# Patient Record
Sex: Male | Born: 1950 | Race: White | Hispanic: No | Smoking: Never smoker
Health system: Southern US, Community
[De-identification: ages and names within clinical notes are randomized; demographics above are authoritative.]

## PROBLEM LIST (undated history)

## (undated) DIAGNOSIS — K4020 Bilateral inguinal hernia, without obstruction or gangrene, not specified as recurrent: Secondary | ICD-10-CM

## (undated) DIAGNOSIS — E785 Hyperlipidemia, unspecified: Secondary | ICD-10-CM

## (undated) DIAGNOSIS — IMO0001 Reserved for inherently not codable concepts without codable children: Secondary | ICD-10-CM

## (undated) DIAGNOSIS — I10 Essential (primary) hypertension: Secondary | ICD-10-CM

## (undated) HISTORY — DX: Reserved for inherently not codable concepts without codable children: IMO0001

## (undated) HISTORY — DX: Essential (primary) hypertension: I10

## (undated) HISTORY — DX: Hyperlipidemia, unspecified: E78.5

---

## 2005-12-15 DIAGNOSIS — E785 Hyperlipidemia, unspecified: Secondary | ICD-10-CM

## 2005-12-15 DIAGNOSIS — I10 Essential (primary) hypertension: Secondary | ICD-10-CM

## 2005-12-15 HISTORY — DX: Essential (primary) hypertension: I10

## 2005-12-15 HISTORY — DX: Hyperlipidemia, unspecified: E78.5

## 2006-02-03 ENCOUNTER — Encounter: Payer: Self-pay | Admitting: Internal Medicine

## 2006-02-03 LAB — HM COLONOSCOPY

## 2006-12-15 DIAGNOSIS — IMO0001 Reserved for inherently not codable concepts without codable children: Secondary | ICD-10-CM

## 2006-12-15 HISTORY — DX: Reserved for inherently not codable concepts without codable children: IMO0001

## 2008-12-23 ENCOUNTER — Emergency Department (HOSPITAL_COMMUNITY): Admission: EM | Admit: 2008-12-23 | Discharge: 2008-12-23 | Payer: Self-pay | Admitting: Emergency Medicine

## 2008-12-26 ENCOUNTER — Encounter: Payer: Self-pay | Admitting: Internal Medicine

## 2008-12-26 LAB — CONVERTED CEMR LAB
BUN: 17 mg/dL
Creatinine, Ser: 0.9 mg/dL
Glucose, Bld: 120 mg/dL
Hgb A1c MFr Bld: 5.7 %
Sodium, serum: 139 mmol/L
Triglyceride fasting, serum: 93 mg/dL

## 2009-07-19 ENCOUNTER — Encounter: Payer: Self-pay | Admitting: Internal Medicine

## 2009-07-19 LAB — CONVERTED CEMR LAB
ALT: 25 units/L
Albumin: 4.3 g/dL
BUN: 17 mg/dL
CO2, serum: 27 mmol/L
CRP: 0.9 mg/dL
Calcium: 9.4 mg/dL
Cholesterol: 138 mg/dL
LDL Cholesterol: 77 mg/dL
Potassium, serum: 4 mmol/L
Sodium, serum: 140 mmol/L
Total Protein: 6.8 g/dL

## 2009-07-20 ENCOUNTER — Encounter: Payer: Self-pay | Admitting: Internal Medicine

## 2009-07-20 LAB — CONVERTED CEMR LAB: Vit D, 25-Hydroxy: 59.8 ng/mL

## 2010-02-25 ENCOUNTER — Encounter (INDEPENDENT_AMBULATORY_CARE_PROVIDER_SITE_OTHER): Payer: Self-pay | Admitting: *Deleted

## 2010-03-25 ENCOUNTER — Ambulatory Visit: Payer: Self-pay | Admitting: Internal Medicine

## 2010-03-25 DIAGNOSIS — I10 Essential (primary) hypertension: Secondary | ICD-10-CM

## 2010-03-25 DIAGNOSIS — E785 Hyperlipidemia, unspecified: Secondary | ICD-10-CM

## 2010-03-26 ENCOUNTER — Ambulatory Visit: Payer: Self-pay | Admitting: Internal Medicine

## 2010-03-27 LAB — CONVERTED CEMR LAB
ALT: 24 units/L (ref 0–53)
Chloride: 107 meq/L (ref 96–112)
GFR calc non Af Amer: 92.15 mL/min (ref >60)
Glucose, Bld: 97 mg/dL (ref 70–99)
HDL: 55.6 mg/dL (ref >39.00)
Potassium: 4.7 meq/L (ref 3.5–5.1)
Sodium: 146 meq/L — ABNORMAL HIGH (ref 135–145)
Total CHOL/HDL Ratio: 3
Triglycerides: 108 mg/dL (ref 0.0–149.0)

## 2010-05-15 ENCOUNTER — Ambulatory Visit: Payer: Self-pay | Admitting: Internal Medicine

## 2010-05-20 ENCOUNTER — Telehealth (INDEPENDENT_AMBULATORY_CARE_PROVIDER_SITE_OTHER): Payer: Self-pay | Admitting: *Deleted

## 2010-05-20 LAB — CONVERTED CEMR LAB
BUN: 17 mg/dL (ref 6–23)
Calcium: 9.2 mg/dL (ref 8.4–10.5)
Cholesterol: 243 mg/dL — ABNORMAL HIGH (ref 0–200)
Creatinine, Ser: 0.9 mg/dL (ref 0.4–1.5)
GFR calc non Af Amer: 98.39 mL/min (ref >60)
Glucose, Bld: 102 mg/dL — ABNORMAL HIGH (ref 70–99)
HDL: 50.5 mg/dL (ref >39.00)
Triglycerides: 86 mg/dL (ref 0.0–149.0)
VLDL: 17.2 mg/dL (ref 0.0–40.0)

## 2010-06-28 ENCOUNTER — Telehealth: Payer: Self-pay | Admitting: Internal Medicine

## 2010-07-29 ENCOUNTER — Telehealth (INDEPENDENT_AMBULATORY_CARE_PROVIDER_SITE_OTHER): Payer: Self-pay | Admitting: *Deleted

## 2010-07-31 ENCOUNTER — Telehealth (INDEPENDENT_AMBULATORY_CARE_PROVIDER_SITE_OTHER): Payer: Self-pay | Admitting: *Deleted

## 2010-08-08 ENCOUNTER — Ambulatory Visit: Payer: Self-pay | Admitting: Internal Medicine

## 2010-08-12 LAB — CONVERTED CEMR LAB
ALT: 20 units/L (ref 0–53)
AST: 21 units/L (ref 0–37)
Cholesterol: 210 mg/dL — ABNORMAL HIGH (ref 0–200)
Direct LDL: 138.5 mg/dL
HDL: 51.2 mg/dL (ref >39.00)

## 2011-01-14 NOTE — Progress Notes (Signed)
Summary: lab results  Phone Note Outgoing Call   Call placed by: [facility] CMA,  May 20, 2010 11:17 AM Details for Reason: advise patient His LDL continued to be elevated despite his healthy lifestyle.  I recommend start Pravachol 40 mg daily, likely it won't cause the muscle aches that crestor did. Please come back fasting in 2 months: FLP  AST ALT---- DX high cholesterol.  No need for office visit,  just lab  Summary of Call: left message to call...................Marland KitchenFelecia Deloach CMA  May 20, 2010 11:17 AM   Follow-up for Phone Call        DISCUSS WITH PATIENT, letter mailed, rx sent to pharmacy...............Marland KitchenFelecia Deloach CMA  May 20, 2010 4:31 PM     New/Updated Medications: PRAVACHOL 40 MG TABS (PRAVASTATIN SODIUM) Take 1 tab  daily Prescriptions: PRAVACHOL 40 MG TABS (PRAVASTATIN SODIUM) Take 1 tab  daily  #30 x 1   Entered and Authorized by:   Jeremy Johann CMA   Signed by:   Jeremy Johann CMA on 05/20/2010   Method used:   [facility]axed to ...       [facility] [address] (retail)       [address]       [place], [place]  [address]       Ph: [phone] or [phone]       Fax: [phone]   RxID:   [id number]

## 2011-01-14 NOTE — Procedures (Signed)
Summary: Colonoscopy/[facility]----internal hemorrhoid, one polyp  Colonoscopy/[facility]   Imported By: Lanelle Bal 04/09/2010 12:05:45  _____________________________________________________________________  External Attachment:    Type:   Image     Comment:   External Document

## 2011-01-14 NOTE — Miscellaneous (Signed)
Summary: colonoscopy report  Clinical Lists Changes  Observations: Added new observation of COLONRECACT: Repeat colonoscopy in 10 years.   (02/03/2006 9:48) Added new observation of COLONOSCOPY: Results: Hemorrhoids (small internal)    Pathology:  Hyperplastic polyp.     Herbert Moors, MD Location:  [facility].    (02/03/2006 9:48)      Colonoscopy  Procedure date:  02/03/2006  Findings:      Results: Hemorrhoids (small internal)    Pathology:  Hyperplastic polyp.     Herbert Moors, MD Location:  [facility].     Comments:      Repeat colonoscopy in 10 years.     Colonoscopy  Procedure date:  02/03/2006  Findings:      Results: Hemorrhoids (small internal)    Pathology:  Hyperplastic polyp.     Herbert Moors, MD Location:  [facility].     Comments:      Repeat colonoscopy in 10 years.    Appended Document: colonoscopy report    report to light to scan  biopsy show a hyperplastic polyp.  Plan per  GI is  to repeat in 10 years

## 2011-01-14 NOTE — Assessment & Plan Note (Signed)
Summary: new to est//bcbs ins//lch   Vital Signs:  Patient profile:   60 year old male Height:      69 inches Weight:      194.4 pounds BMI:     28.81 Pulse rate:   60 / minute BP sitting:   140 / 90  Vitals Entered By: Shary Decamp (March 25, 2010 11:50 AM) CC: new pt to establish, would like to change meds to generic, pt was on zetia 10 but d/c'd due to some of the things he read in the media, not fasting Comments BP @ home 118-120/80s [facility]  March 25, 2010 11:51 AM    History of Present Illness: new pt to establish would like to change meds to generic  high cholesterol  pt was on zetia 10 but self  d/c'd  2 months ago due to some of the things he read in the media on crestor as well x 2-3 years    Hypertension on Azor, metoprolol XR  Preventive Screening-Counseling & Management  Alcohol-Tobacco     Smoking Status: never  Caffeine-Diet-Exercise     Caffeine use/day: 2     Does Patient Exercise: yes     Times/week: 4  Current Medications (verified): 1)  Crestor 40 Mg Tabs (Rosuvastatin Calcium) .... 1/2 By Mouth Qd 2)  Bayer Low Strength 81 Mg Tbec (Aspirin) .... Once Daily 3)  Azor 5-20 Mg Tabs (Amlodipine-Olmesartan) .Marland Kitchen.. 1 By Mouth Once Daily 4)  Metoprolol Succinate 50 Mg Xr24h-Tab (Metoprolol Succinate) .... Once Daily 5)  Flonase 50 Mcg/act Susp (Fluticasone Propionate) .... 2 Sprays Each Nostril Once Daily As Needed Seasonal Allergies  Allergies (verified): 1)  ! 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 Resp:  Denies cough and shortness of breath. GI:  Denies bloody stools, diarrhea, nausea, and vomiting. GU:  Denies dysuria, urinary frequency, and urinary hesitancy. MS:  occasionally aches and pains, more since he started crestor .  Physical Exam  General:  alert and well-developed.   Neck:  no masses and no thyromegaly.   Lungs:  normal respiratory effort, no intercostal retractions, no accessory muscle use, and normal breath sounds.   Heart:  normal rate, regular rhythm, and no murmur.   Extremities:  no pretibial edema bilaterally  Psych:  Oriented X3, memory intact for recent and remote, normally interactive, good eye contact, not anxious appearing, and not depressed appearing.     Impression & Recommendations:  Problem # 1:  HYPERTENSION (ICD-401.9) Cost is a issue   Will discontinue Azor and start AMLODIPINE BESY-BENAZEPRIL HCL 5-40  side effects including cough,  discussed Will continue with metoprolo as it is   His updated medication list for this problem includes:    Amlodipine Besy-benazepril Hcl 5-40 Mg Caps (  Amlodipine besy-benazepril hcl) .Marland Kitchen... 1 by mouth once daily    Metoprolol Succinate 50 Mg Xr24h-tab (Metoprolol succinate) ..... Once daily  Problem # 2:  HYPERLIPIDEMIA (ICD-272.4) his last cholesterol was 138, HDL 57, LDL 77 (8-10)  was on Crestor and zetia (which he took inconsistently) patient has more aches and pains ( although not severe)  since he started Crestor we elected to see how he's doing with he's healthy diet - exercise-crestor, then re asses his cholesterol off meds  Plan: Check labs while in Crestor, then D/C it come back in 6 weeks, fasting I would obtain an  FLP for baseline Will keep in mind that he  has a family history heart disease, his LDL goal is 100 benefits of statins in general discussed  The following medications were removed from the medication list:    Crestor 40 Mg Tabs (Rosuvastatin calcium) .Marland Kitchen... 1/2 by mouth qd  Complete Medication List: 1)  Bayer Low Strength 81 Mg Tbec (Aspirin) .... Once daily 2)  Amlodipine Besy-benazepril Hcl 5-40 Mg Caps (Amlodipine besy-benazepril hcl) .Marland Kitchen.. 1 by mouth once daily 3)  Metoprolol Succinate 50 Mg Xr24h-tab (Metoprolol succinate) .... Once daily 4)  Flonase 50 Mcg/act Susp (Fluticasone propionate) .... 2 sprays each nostril once daily as needed seasonal allergies  Patient Instructions: 1)  continue taking Crestor until  you have thwe blood  work 2)  come back fasting: FLP AST ALT dx high cholesterol, BMP dx and hypertension 3)  switch from azor to the new BP med 4)  Check your blood pressure 2 or 3 times a week. If it is more than 140/85 consistently,please let us know  5)  call if side effects from that new medication 6)  Please schedule a follow-up appointment in 6 weeks (fasting) Prescriptions: FLONASE 50 MCG/ACT SUSP (FLUTICASONE PROPIONATE) 2 sprays each nostril once daily as needed seasonal allergies  #1 x 12   Entered and Authorized by:   Nolon Rod. Angelia Hazell MD   Signed by:   Nolon Rod. Kirsten Spearing MD on 03/25/2010   Method used:   Print then Give to Patient   RxID:   [id number] METOPROLOL SUCCINATE 50 MG XR24H-TAB (METOPROLOL SUCCINATE) once daily  #30 x 12   Entered and Authorized by:   Kwasi Joung E. Javiana Anwar MD   Signed by:   Nolon Rod. Coreon Simkins MD on 03/25/2010   Method used:   Print then Give to Patient   RxID:   [id number] AMLODIPINE BESY-BENAZEPRIL HCL 5-40 MG CAPS (AMLODIPINE BESY-BENAZEPRIL HCL) 1 by mouth once daily  #30 x 3   Entered and Authorized by:   Nolon Rod. Nayellie Sanseverino MD   Signed by:   Nolon Rod. Leonidas Boateng MD on 03/25/2010   Method used:   Print then Give to Patient   RxID:   [id number]    Risk Factors:  Tobacco use:  never Caffeine  use:  2 drinks per day Alcohol use:  yes    Comments:  socially Exercise:  yes    Times per week:  4   Appended Document: new to est//bcbs ins//lch office visit notes from Dr.Altheimer reviewed, they are to light to be scanned: --history of tinnitus since 2009, he has seen an ENT in [place] and later on saw Dr. Tia Masker on December 2009, was treated with antibiotics high-dose of prednisone reportedly had a normal brain MRI --he went to the urgent care and then transferred to [facility]  ER on 12/23/2008 he was diagnosed with atypical chest pain and elevated BP  he saw Dr.Kersey 12/28/2008,  echo stress test on 01/02/2009 which was normal

## 2011-01-14 NOTE — Miscellaneous (Signed)
Summary: labs from previous MD  Clinical Lists Changes  Observations: Added new observation of HGBA1C: 5.7 % (12/26/2008 11:33) Added new observation of TRIGLYCERIDE: 93 mg/dL (21/30/8657 84:69) Added new observation of HDL: 70 mg/dL (62/95/2841 32:44) Added new observation of LDL: 81 mg/dL (12/17/7251 66:44) Added new observation of CHOLESTEROL: 170 mg/dL (03/47/4259 56:38) Added new observation of SGPT (ALT): 22 units/L (12/26/2008 11:33) Added new observation of SGOT (AST): 21 units/L (12/26/2008 11:33) Added new observation of BG RANDOM: 120 mg/dL (75/64/3329 51:88) Added new observation of CREATININE: 0.9 mg/dL (41/66/0630 16:01) Added new observation of BUN: 17 mg/dL (09/32/3557 32:20) Added new observation of POTASSIUM: 3.9 mmol/L (12/26/2008 11:33) Added new observation of SODIUM: 139 mmol/L (12/26/2008 11:33)      Chemistry Labs Test Date: 12/26/2008                      Value Units        H/L   Reference  Sodium:             139   mmol/L             (137-145) Potassium:          3.9   mmol/L             (3.6-5.0) BUN:                17    mg/dL              (2-54) Creatinine:         0.9   mg/dL              (2.7-0.6) Glucose-random:     120   mg/dL         H    (23-762) SGOT:               21    U/L                (10-40) SGPT:               22    U/L                (10-40)    Lipid Panel Test Date: 12/26/2008                        Value        Units        H/L   Reference  Cholesterol:          170          mg/dL              (831-517) LDL Cholesterol:      81           mg/dL              (61-607) HDL Cholesterol:      70           mg/dL              (37-10) Triglyceride:         93           mg/dL              (62-694) WNIO2V                5.7

## 2011-01-14 NOTE — Assessment & Plan Note (Signed)
Summary: 6 week fu/fasting/kdc   Vital Signs:  Patient profile:   60 year old male Height:      69 inches Weight:      187 pounds Temp:     97.8 degrees F oral Pulse rate:   68 / minute BP sitting:   118 / 70  (left arm)  Vitals Entered By: Jeremy Johann CMA (May 15, 2010 8:25 AM) CC: 6 week f/u,fasting Comments REVIEWED MED LIST, PATIENT AGREED DOSE AND INSTRUCTION CORRECT    History of Present Illness: here for a followup Doing well See last office visit, we agreed to discontinue Crestor and recheck his cholesterol today. His diet has improved significantly, he has lost some weight  Allergies: 1)  ! Pcn  Past History:  Past Medical History: Reviewed history from 03/25/2010 and no changes required. Hyperlipidemia--  dx aprox 2007 Hypertension--  dx aprox 2007 stress test aprox 2008 , neg per patient   Past Surgical History: Reviewed history from 03/25/2010 and no changes required. Denies surgical history  Social History: Reviewed history from 03/25/2010 and no changes required. 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 Occasionally he feels dizzy when he stands up quickly. No generalized weakness or fatigue. no cough After he discontinued Crestor the  aches and pains decreased substantially, he is feeling great. He's taking fish oil and red rise yeast to help his cholesterol lower extremity edema at the end of the day, not consistently, only from time to time.  Physical Exam  General:  alert, well-developed, and well-nourished.   Lungs:  normal respiratory effort, no intercostal retractions, no accessory muscle use, and normal breath sounds.   Heart:  normal rate, regular rhythm, and no murmur.   Extremities:  no lower extremity edema   Impression &  Recommendations:  Problem # 1:  HYPERTENSION (ICD-401.9) we switched to a generic after last office visit, he is tolerating great. His blood pressure may be over controlled, he gets dizzy from time to time when he stands up. Plan---decrease metoprolol from 50 to 25 mg daily His updated medication list for this problem includes:    Amlodipine Besy-benazepril Hcl 5-40 Mg Caps (Amlodipine besy-benazepril hcl) .Marland Kitchen... 1 by mouth once daily    Metoprolol Succinate 50 Mg Xr24h-tab (Metoprolol succinate) .Marland Kitchen... 1/2 by mouth once daily  BP today: 118/70 Prior BP: 140/90 (03/25/2010)  Labs Reviewed: K+: 4.7 (03/26/2010) Creat: : 0.9 (03/26/2010)   Chol: 170 (03/26/2010)   HDL: 55.60 (03/26/2010)   LDL: 93 (03/26/2010)   TG: 108.0 (03/26/2010)  Orders: TLB-BMP (Basic Metabolic Panel-BMET) (80048-METABOL)  Problem # 2:  HYPERLIPIDEMIA (ICD-272.4) the last fasting lipid profile reflects the results of taking Crestor, he is now off Crestor for 6 weeks, his diet has improved, he has lost some weight He also had aches and pains with crestor. Plan: Recheck his cholesterol, LDL goal is around 100 given  his family history Labs Reviewed: SGOT: 29 (03/26/2010)   SGPT: 24 (03/26/2010)   HDL:55.60 (03/26/2010), 57 (07/19/2009)  LDL:93 (03/26/2010), 77 (07/19/2009)  Chol:170 (03/26/2010), 138 (07/19/2009)  Trig:108.0 (03/26/2010), 79 (07/19/2009)  Orders: Venipuncture ([address]) TLB-Lipid Panel (80061-LIPID)  Problem # 3:  has a 2 mm cyst at the left upper eyelid patient is planning to contact ophthalmology, will call if a referral is needed  Complete Medication List: 1)  Bayer Low Strength 81 Mg Tbec (Aspirin) .... Once daily 2)  Amlodipine Besy-benazepril Hcl 5-40 Mg Caps (Amlodipine besy-benazepril hcl) .Marland Kitchen.. 1 by mouth once daily 3)  Metoprolol Succinate 50 Mg Xr24h-tab (Metoprolol succinate) .... 1/2 by mouth once daily 4)  Flonase 50 Mcg/act Susp (Fluticasone propionate) .... 2 sprays each nostril  once daily as needed seasonal allergies  Patient Instructions: 1)  Please schedule a follow-up appointment in 6 months .

## 2011-01-14 NOTE — Progress Notes (Signed)
Summary: feet swelling  Phone Note Call from Patient Call back at [phone] cell   Caller: Patient Call For: [facility] E. Keelon Zurn MD Summary of Call: Pt called stating that he is now having swelling in his feet and ankles by the end of the day. Bp at home 105/68.  Please advise. If BP med is changed he would like a generic med.  Nicki Guadalajara Fergerson CMA Duncan Dull)  June 28, 2010 11:14 AM   Follow-up for Phone Call        to prevent leg swelling I recommend the following: -low salt diet -elevate the legs -discontinue amlodipine/benazepril  and take losartan 100mg  1 a day -Nurse visit for BP check and BMP in 4 weeks Moriah Shawley E. Myrtice Lowdermilk MD  June 28, 2010 3:52 PM   Additional Follow-up for Phone Call Additional follow up Details #1::        Pt is aware of instructions. Army Fossa CMA  June 28, 2010 4:33 PM     New/Updated Medications: COZAAR 100 MG TABS (LOSARTAN POTASSIUM) 1 by mouth daily. Prescriptions: COZAAR 100 MG TABS (LOSARTAN POTASSIUM) 1 by mouth daily.  #30 x 0   Entered by:   Army Fossa CMA   Authorized by:   Nolon Rod. Massiel Stipp MD   Signed by:   Army Fossa CMA on 06/28/2010   Method used:   [facility]        [phone][facility] ([facility]ail[facility]       [address]       [place], [place]  [address]       Ph: [phone] or [phone]       Fax: [phone]   RxID:   [id number]

## 2011-01-14 NOTE — Letter (Signed)
Summary: New Patient Letter  [facility]  [address] [place], [place] [address]   Phone: [phone]  Fax: [phone]       02/25/2010 MRN: [id number]  [facility] [address] [place], [place]  [address]  Dear Mr. [facility],   Welcome to [facility] and thank you for choosing [place] as your Primary Care Providers. Enclosed you will find information about our practice that we hope you find helpful. We have also enclosed forms to be filled out prior to your visit. This will provide [place] with the necessary information and facilitate your being seen in a timely manner. If you have any questions, please call us at:  [phone] and we will be happy to assist you. We look forward to seeing you at your scheduled appointment time.  Appointment   Tuesday March 19, 2010 @ 3:20pm  with Dr.  Drue Novel      Sincerely,  Primary Health Care Team  Please arrive 15 minutes early for your first appointment and bring your insurance card. Co-pay is required at the time of your visit.  *****Please call the office if you are not able to keep this appointment. There is a charge of $50.00 if any appointment is not cancelled or rescheduled within 24 hours.

## 2011-01-14 NOTE — Miscellaneous (Signed)
Summary: labs from previous MD  Clinical Lists Changes  Observations: Added new observation of VIT D 25-OH: 59.8 ng/mL (07/20/2009 10:01) Added new observation of PROTEIN, TOT: 6.8 g/dL (16/09/9603 54:09) Added new observation of ALBUMIN: 4.3 g/dL (81/19/1478 29:56) Added new observation of BILI TOTAL: 0.6 mg/dL (21/30/8657 84:69) Added new observation of ALK PHOS: 51 units/L (07/19/2009 10:00) Added new observation of CALCIUM: 9.4 mg/dL (62/95/2841 32:44) Added new observation of BG RANDOM: 111 mg/dL (12/17/7251 66:44) Added new observation of CREATININE: 1.1 mg/dL (03/47/4259 56:38) Added new observation of BUN: 17 mg/dL (75/64/3329 51:88) Added new observation of CO2 TOTAL: 27 mmol/L (07/19/2009 10:00) Added new observation of CHLORIDE: 105 mmol/L (07/19/2009 10:00) Added new observation of POTASSIUM: 4.0 mmol/L (07/19/2009 10:00) Added new observation of SODIUM: 140 mmol/L (07/19/2009 10:00) Added new observation of SGPT (ALT): 25 units/L (07/19/2009 9:58) Added new observation of SGOT (AST): 24 units/L (07/19/2009 9:58) Added new observation of HGBA1C: 5.2 % (07/19/2009 9:58) Added new observation of CRP: 0.90 mg/dL (41/66/0630 1:60) Added new observation of TRIGLYCERIDE: 79 mg/dL (10/93/2355 7:32) Added new observation of HDL: 57 mg/dL (20/25/4270 6:23) Added new observation of LDL: 77 mg/dL (76/28/3151 7:61) Added new observation of CHOLESTEROL: 138 mg/dL (60/73/7106 2:69)      Lipid Panel Test Date: 07/19/2009                        Value        Units        H/L   Reference  Cholesterol:          138          mg/dL              (485-462) LDL Cholesterol:      77           mg/dL              (70-350) HDL Cholesterol:      57           mg/dL              (09-38) Triglyceride:         79           mg/dL              (18-299) CRP                   0.90                            HgBA1c                5.2                             SGOT (AST)            24                               SGPT (ALT)            25                                 Chemistry Labs Test Date: 07/19/2009  Value Units        H/L   Reference  Sodium:             140   mmol/L             (137-145) Potassium:          4.0   mmol/L             (3.6-5.0) Chloride:           105   mmol/L             (101-111) CO2:                27    mmol/L             (22-31) BUN:                17    mg/dL              (1-61) Creatinine:         1.1   mg/dL              (0.9-6.0) Glucose-random:     111   mg/dL         H    (45-409) Calcium (total):    9.4   mg/dL              (8-11.9) Alkaline P'tase:    51    U/L                (10-120) T. Bili:            0.6   mg/dL              (1.4-7.8) Albumin:            4.3   g/dL               (3-5) Total Protein:      6.8   g/dL               (4-7)   Lab Entry Test Date: 07/20/2009                        Value        Units        H/L   Reference  Vit D. 25-OH:         59.8         ng/ml              (16-74)

## 2011-01-14 NOTE — Progress Notes (Signed)
Summary: labwork  Phone Note Outgoing Call   Call placed by: Army Fossa CMA,  July 29, 2010 8:05 AM Summary of Call: Needs labwork:   FLP  AST ALT---- DX high cholesterol.  Follow-up for Phone Call        PT HAS APPT 08/08/10 FOR LABS.Marland KitchenKalyn [name]  July 29, 2010 11:21 AM

## 2011-01-14 NOTE — Progress Notes (Signed)
Summary: REFILL REQUEST  Phone Note Refill Request Call back at [phone] Message from:  Pharmacy on July 31, 2010 8:05 AM  Refills Requested: Medication #1:  COZAAR 100 MG TABS 1 by mouth daily.   Dosage confirmed as above?Dosage Confirmed   Supply Requested: 1 month   Last Refilled: 06/28/[facility] [address]  Next Appointment Scheduled: 08/08/10 Initial call taken by: Lavell Islam,  July 31, 2010 8:05 AM    Prescriptions: COZAAR 100 MG TABS (LOSARTAN POTASSIUM) 1 by mouth daily.  #30 x 3   Entered by:   Army Fossa CMA   Authorized by:   Nolon Rod. Paz MD   Signed by:   Army Fossa CMA on 07/31/2010   Method used:   [facility]        [facility] [phone][facility] (retail)       [address]       [place], [place]  [address]       Ph: [phone] or [phone]       Fax: [phone]   RxID:   [id number]

## 2011-03-10 ENCOUNTER — Other Ambulatory Visit: Payer: Self-pay | Admitting: Internal Medicine

## 2011-03-31 LAB — POCT I-STAT, CHEM 8
BUN: 16 mg/dL (ref 6–23)
Calcium, Ion: 1.11 mmol/L — ABNORMAL LOW (ref 1.12–1.32)
Creatinine, Ser: 1 mg/dL (ref 0.4–1.5)
Hemoglobin: 14.3 g/dL (ref 13.0–17.0)
Potassium: 4 mEq/L (ref 3.5–5.1)
TCO2: 25 mmol/L (ref 0–100)

## 2011-03-31 LAB — POCT CARDIAC MARKERS
Myoglobin, poc: 75.8 ng/mL (ref 12–200)
Myoglobin, poc: 82.6 ng/mL (ref 12–200)
Troponin i, poc: <0.05 ng/mL (ref 0.00–0.09)

## 2011-03-31 LAB — DIFFERENTIAL
Basophils Relative: 0 % (ref 0–1)
Lymphocytes Relative: 12 % (ref 12–46)
Monocytes Relative: 9 % (ref 3–12)
Neutro Abs: 5.6 10*3/uL (ref 1.7–7.7)
Neutrophils Relative %: 77 % (ref 43–77)

## 2011-03-31 LAB — CBC
MCHC: 34 g/dL (ref 30.0–36.0)
RBC: 4.61 MIL/uL (ref 4.22–5.81)
WBC: 7.3 10*3/uL (ref 4.0–10.5)

## 2011-04-15 ENCOUNTER — Ambulatory Visit (INDEPENDENT_AMBULATORY_CARE_PROVIDER_SITE_OTHER): Payer: BC Managed Care – PPO | Admitting: Internal Medicine

## 2011-04-15 ENCOUNTER — Encounter: Payer: Self-pay | Admitting: Internal Medicine

## 2011-04-15 VITALS — BP 128/84 | HR 62 | Ht 68.5 in | Wt 188.4 lb

## 2011-04-15 DIAGNOSIS — I10 Essential (primary) hypertension: Secondary | ICD-10-CM

## 2011-04-15 DIAGNOSIS — Z23 Encounter for immunization: Secondary | ICD-10-CM

## 2011-04-15 DIAGNOSIS — Z136 Encounter for screening for cardiovascular disorders: Secondary | ICD-10-CM

## 2011-04-15 DIAGNOSIS — Z0000 Encounter for general adult medical examination without abnormal findings: Secondary | ICD-10-CM

## 2011-04-15 LAB — CBC WITH DIFFERENTIAL/PLATELET
Basophils Relative: 0.3 % (ref 0.0–3.0)
Eosinophils Absolute: 0.1 10*3/uL (ref 0.0–0.7)
Eosinophils Relative: 2 % (ref 0.0–5.0)
Hemoglobin: 14.2 g/dL (ref 13.0–17.0)
Lymphocytes Relative: 20.6 % (ref 12.0–46.0)
Monocytes Relative: 6.7 % (ref 3.0–12.0)
Neutro Abs: 4.5 10*3/uL (ref 1.4–7.7)
Neutrophils Relative %: 70.4 % (ref 43.0–77.0)
RBC: 4.55 Mil/uL (ref 4.22–5.81)
WBC: 6.4 10*3/uL (ref 4.5–10.5)

## 2011-04-15 LAB — LIPID PANEL
Cholesterol: 197 mg/dL (ref 0–200)
LDL Cholesterol: 132 mg/dL — ABNORMAL HIGH (ref 0–99)
Total CHOL/HDL Ratio: 4
VLDL: 14.8 mg/dL (ref 0.0–40.0)

## 2011-04-15 LAB — BASIC METABOLIC PANEL
BUN: 19 mg/dL (ref 6–23)
Chloride: 106 mEq/L (ref 96–112)
GFR: 88.41 mL/min (ref >60.00)
Glucose, Bld: 88 mg/dL (ref 70–99)
Potassium: 4.5 mEq/L (ref 3.5–5.1)
Sodium: 143 mEq/L (ref 135–145)

## 2011-04-15 LAB — AST: AST: 22 U/L (ref 0–37)

## 2011-04-15 MED ORDER — PRAVASTATIN SODIUM 40 MG PO TABS: 40.0000 mg | ORAL_TABLET | Freq: Every day | ORAL | Status: DC

## 2011-04-15 MED ORDER — LOSARTAN POTASSIUM 100 MG PO TABS: ORAL_TABLET | ORAL | Status: DC

## 2011-04-15 MED ORDER — TETANUS-DIPHTH-ACELL PERTUSSIS 5-2.5-18.5 LF-MCG/0.5 IM SUSP
0.5000 mL | Freq: Once | INTRAMUSCULAR | Status: AC
  Administered 2011-04-15: 0.5 mL via INTRAMUSCULAR

## 2011-04-15 MED ORDER — METOPROLOL SUCCINATE ER 50 MG PO TB24: ORAL_TABLET | ORAL | Status: DC

## 2011-04-15 NOTE — Assessment & Plan Note (Signed)
continue same medications

## 2011-04-15 NOTE — Progress Notes (Signed)
  Subjective:    Patient ID: Ralph [name], male    DOB: 1951/11/24, 60 y.o.   MRN: [id number]  HPI CPX occ edema at feet at the end of the day, mild  Past Medical History  Diagnosis Date  . Hyperlipidemia 2007  . Hypertension 2007  . Normal cardiac stress test 2008    per pt   No past surgical history on file. Family History  Problem Relation Age of Onset  . Breast cancer Mother   . Bone cancer Mother   . Coronary artery disease Father     w/ first MI age 18, GF MI in the early 26s  . Prostate cancer Neg Hx   . Colon cancer Neg Hx   . Hypertension Sister   . Diabetes Neg Hx    History   Social History  . Marital Status: Divorced    Spouse Name: N/A    Number of Children: 1  . Years of Education: N/A   Occupational History  . sales, hearing aid    Social History Main Topics  . Smoking status: Never Smoker   . Smokeless tobacco: Not on file  . Alcohol Use: Yes     socially  . Drug Use: No  . Sexually Active: Not on file   Other Topics Concern  . Not on file   Social History Narrative   Diet- healthy for the most part...Marland KitchenMarland KitchenExercise--regular in the gym...[place]       Review of Systems  Constitutional: Negative for fever, fatigue and unexpected weight change.  Respiratory: Negative for cough and shortness of breath.   Cardiovascular: Negative for chest pain and palpitations.       Rarely has CP, previous stress test (-), sx not crescendo  Gastrointestinal: Negative for abdominal pain and blood in stool.  Genitourinary: Negative for hematuria and difficulty urinating.  Psychiatric/Behavioral:       No anxiety, depression       Objective:   Physical Exam  Constitutional: He is oriented to person, place, and time. He appears well-developed and well-nourished. No distress.  HENT:  Head: Normocephalic and atraumatic.  Eyes: No scleral icterus.  Neck: No thyromegaly present.       Normal carotid pulses B  Cardiovascular: Normal  rate, regular rhythm and normal heart sounds.   No murmur heard. Pulmonary/Chest: Effort normal and breath sounds normal. No respiratory distress. He has no wheezes. He has no rales.  Abdominal: Soft. Bowel sounds are normal. He exhibits no distension. There is no tenderness. There is no rebound and no guarding.  Genitourinary: Rectum normal and prostate normal.  Musculoskeletal: He exhibits no edema.  Neurological: He is alert and oriented to person, place, and time.  Skin: Skin is warm and dry.  Psychiatric: He has a normal mood and affect. His behavior is normal. Judgment and thought content normal.          Assessment & Plan:

## 2011-04-15 NOTE — Assessment & Plan Note (Addendum)
Td today Cscope 01-2006, told next in 10 years Diet-exercise discussed + FH CAD, occ -chronic CP, pt thinks is muscleskeletal (usually the next day after wt lifting), neg stress test 2008: rec observation, sx of AMI discussed . ASA qd Labs  Since he is doing very well will recommend to RTC 1 year Also rec to see derm ,has fair skin and multiple skin lesions  EKG WNL

## 2011-04-15 NOTE — Patient Instructions (Signed)
See your dermatologist once a year

## 2011-04-17 ENCOUNTER — Telehealth: Payer: Self-pay | Admitting: *Deleted

## 2011-04-17 NOTE — Telephone Encounter (Signed)
Message copied by Army Fossa on Thu Apr 17, 2011  1:23 PM ------      Message from: Willow Ora      Created: Thu Apr 17, 2011 12:34 PM       Advise patient:      His LDL cholesterol is 132. Given his family history of heart dz, his goal is less than 100.      Recommend to switch from Pravachol 40mg  to Lipitor 40 mg 1 tablet daoly, call in a prescription #30 and 4 RF; give a discount card.      Please arrange for a FLP, AST, ALT in 3 months.      Patient to let me know if he has side effects.      All other labs very good.

## 2011-04-17 NOTE — Telephone Encounter (Signed)
Message left for patient to return my call.  

## 2011-04-18 MED ORDER — ATORVASTATIN CALCIUM 40 MG PO TABS: 40.0000 mg | ORAL_TABLET | Freq: Every day | ORAL | Status: DC

## 2011-04-18 NOTE — Telephone Encounter (Signed)
I spoke w/ pt he is aware. Mailed labs and discount card per pt request.

## 2011-06-14 ENCOUNTER — Other Ambulatory Visit: Payer: Self-pay | Admitting: Internal Medicine

## 2011-10-21 ENCOUNTER — Other Ambulatory Visit: Payer: Self-pay | Admitting: Internal Medicine

## 2011-11-19 ENCOUNTER — Other Ambulatory Visit: Payer: Self-pay | Admitting: Internal Medicine

## 2012-02-04 ENCOUNTER — Encounter: Payer: Self-pay | Admitting: Internal Medicine

## 2012-02-04 ENCOUNTER — Ambulatory Visit (INDEPENDENT_AMBULATORY_CARE_PROVIDER_SITE_OTHER): Payer: BC Managed Care – PPO | Admitting: Internal Medicine

## 2012-02-04 VITALS — BP 158/88 | HR 53 | Temp 98.0°F | Wt 190.0 lb

## 2012-02-04 DIAGNOSIS — J069 Acute upper respiratory infection, unspecified: Secondary | ICD-10-CM

## 2012-02-04 DIAGNOSIS — I10 Essential (primary) hypertension: Secondary | ICD-10-CM

## 2012-02-04 DIAGNOSIS — N529 Male erectile dysfunction, unspecified: Secondary | ICD-10-CM

## 2012-02-04 MED ORDER — TADALAFIL 20 MG PO TABS: 10.0000 mg | ORAL_TABLET | ORAL | Status: DC | PRN

## 2012-02-04 MED ORDER — SILDENAFIL CITRATE 100 MG PO TABS: 50.0000 mg | ORAL_TABLET | Freq: Every day | ORAL | Status: DC | PRN

## 2012-02-04 MED ORDER — AZITHROMYCIN 250 MG PO TABS: ORAL_TABLET | ORAL | Status: AC

## 2012-02-04 NOTE — Assessment & Plan Note (Signed)
BP slightly elevated today, may be related to Afrin that he use this morning. Recommend to check from time to time, goal  120/80

## 2012-02-04 NOTE — Patient Instructions (Signed)
Rest, fluids , tylenol For cough, take Mucinex DM twice a day as needed  For congestion use flonase for the next few days Take the antibiotic as prescribed ----> zithromax Call if no better in few days Call anytime if the symptoms are severe --------------------------------------------------------- Try either cialis or viagra as prescribed Do not mix them Call for a refill of the one you like the most

## 2012-02-04 NOTE — Progress Notes (Signed)
  Subjective:    Patient ID: Ralph [name], male    DOB: [birth year], 62 y.o.   MRN: [id number]  HPI Acute visit 10 days' history of sinus pressure on and off, blowing out very small amounts of mucus if any. He took afrin today and he feels better.  Also, reports a decreased libido and from time to time his erections don't last as much as they used to. He has tried Viagra and Cialis from a friend ---> had no  side effects and would like a prescription. At the same time wonders if it is a good idea check his testosterone.   Past medical history Hyperlipidemia dx  2007 Hypertension dx 2007 Normal stress test per patient, 2008.  Past surgical history Negative  Review of Systems No fever chills No sore throat Very mild dry cough, mostly due to clear his throat. Denies any itchy eyes or itchy nose sneezing sometimes     Objective:   Physical Exam  Constitutional: He is oriented to person, place, and time. He appears well-developed and well-nourished.  HENT:  Head: Normocephalic and atraumatic.  Right Ear: External ear normal.  Left Ear: External ear normal.       Throat without redness, tonsils not enlarged. No discharge. Face symmetric, nose slightly congested  Cardiovascular: Normal rate, regular rhythm and normal heart sounds.   No murmur heard. Pulmonary/Chest: Effort normal and breath sounds normal. No respiratory distress. He has no wheezes. He has no rales.  Neurological: He is alert and oriented to person, place, and time.  Skin: Skin is warm and dry.  Psychiatric: He has a normal mood and affect. His behavior is normal. Judgment and thought content normal.          Assessment & Plan:  URI, early sinustis: See instructions

## 2012-02-04 NOTE — Assessment & Plan Note (Addendum)
he reports a history of mild difficulty with erections and also wonders about a testosterone check. We had a long conversation about low testosterone, I explained to the patient that the interpretation of a testosterone level is a clinical one  not just  "a number" . Even if his testosterone is low, he may not get any improvement on his mild symptoms if he gets HRT. Today , I spent more than 25  min with the patient, >50% of the time counseling

## 2012-02-12 ENCOUNTER — Telehealth: Payer: Self-pay | Admitting: *Deleted

## 2012-02-12 ENCOUNTER — Telehealth: Payer: Self-pay | Admitting: Internal Medicine

## 2012-02-12 NOTE — Telephone Encounter (Signed)
Noted  

## 2012-02-12 NOTE — Telephone Encounter (Signed)
Office Message [address] [place], [place] [address] p. [phone] f. [phone] To: [facility] (Daytime Triage) Fax: [phone] From: [facility] Date/ Time: 02/12/2012 10:45 AM Taken By: Craig Guess, RN Caller: Eris Facility: not collected Patient: Ralph, [name] DOB: [birth year] Phone: [phone] Reason for Call: Caller would like PCP to be aware that he resumed his Metoprolol 50mg  q day as he has taken in the past. (Medication was reduced to 25mg  after physical 5/12) BP was elevated at recent sick appt and he began to monitor BP. BP was 140-150/90's ranges and he began 50mg  again. He would like MD to be aware of same. Call him prn at [phone].

## 2012-02-12 NOTE — Telephone Encounter (Signed)
Fax from [facility]  [address] [place] [address] P# [phone] Fax to say Patients insurance copmny requires patient to to 6-tablets every 32-days. Need new RX for 6-tabs. Thanks

## 2012-02-13 ENCOUNTER — Other Ambulatory Visit: Payer: Self-pay | Admitting: *Deleted

## 2012-02-13 MED ORDER — SILDENAFIL CITRATE 100 MG PO TABS: 50.0000 mg | ORAL_TABLET | Freq: Every day | ORAL | Status: DC | PRN

## 2012-02-13 MED ORDER — TADALAFIL 20 MG PO TABS: 10.0000 mg | ORAL_TABLET | ORAL | Status: DC | PRN

## 2012-03-10 ENCOUNTER — Telehealth: Payer: Self-pay | Admitting: Internal Medicine

## 2012-03-10 NOTE — Telephone Encounter (Signed)
Scheduled for tomorroat @ 8am.

## 2012-03-10 NOTE — Telephone Encounter (Signed)
Pt came into the office today to ask about getting an appt for his itchy rash. He states his girlfriend was treated for it about a month ago and the cream she was prescribed made it better. He would like an appt in the morning or a rx for some itch cream.

## 2012-03-10 NOTE — Telephone Encounter (Signed)
Please advise 

## 2012-03-10 NOTE — Telephone Encounter (Signed)
OV tomorrow,  overbook ok

## 2012-03-11 ENCOUNTER — Ambulatory Visit (INDEPENDENT_AMBULATORY_CARE_PROVIDER_SITE_OTHER): Payer: BC Managed Care – PPO | Admitting: Internal Medicine

## 2012-03-11 VITALS — BP 148/84 | HR 59 | Temp 98.2°F | Wt 187.0 lb

## 2012-03-11 DIAGNOSIS — R21 Rash and other nonspecific skin eruption: Secondary | ICD-10-CM

## 2012-03-11 DIAGNOSIS — I10 Essential (primary) hypertension: Secondary | ICD-10-CM

## 2012-03-11 MED ORDER — CROTAMITON 10 % EX CREA: TOPICAL_CREAM | CUTANEOUS | Status: DC

## 2012-03-11 NOTE — Assessment & Plan Note (Signed)
Patient self increased beta blocker dose from 25-50 mg because his BP was slightly high. Since then, he reports normal ambulatory BPs.

## 2012-03-11 NOTE — Patient Instructions (Signed)
Scabies Scabies are small bugs (mites) that burrow under the skin and cause red bumps and severe itching. These bugs can only be seen with a microscope. Scabies are highly contagious. They can spread easily from person to person by direct contact. They are also spread through sharing clothing or linens that have the scabies mites living in them. It is not unusual for an entire family to become infected through shared towels, clothing, or bedding.  HOME CARE INSTRUCTIONS   Your caregiver may prescribe a cream or lotion to kill the mites. If this cream is prescribed; massage the cream into the entire area of the body from the neck to the bottom of both feet. Also massage the cream into the scalp and face if your child is less than 1 year old. Avoid the eyes and mouth.   Leave the cream on for 8 to12 hours. Do not wash your hands after application. Your child should bathe or shower after the 8 to 12 hour application period. Sometimes it is helpful to apply the cream to your child at right before bedtime.   One treatment is usually effective and will eliminate approximately 95% of infestations. For severe cases, your caregiver may decide to repeat the treatment in 1 week. Everyone in your household should be treated with one application of the cream.   New rashes or burrows should not appear after successful treatment within 24 to 48 hours; however the itching and rash may last for 2 to 4 weeks after successful treatment. If your symptoms persist longer than this, see your caregiver.   Your caregiver also may prescribe a medication to help with the itching or to help the rash go away more quickly.   Scabies can live on clothing or linens for up to 3 days. Your entire child's recently used clothing, towels, stuffed toys, and bed linens should be washed in hot water and then dried in a dryer for at least 20 minutes on high heat. Items that cannot be washed should be enclosed in a plastic bag for at least 3  days.   To help relieve itching, bathe your child in a cool bath or apply cool washcloths to the affected areas.   Your child may return to school after treatment with the prescribed cream.  SEEK MEDICAL CARE IF:   The itching persists longer than 4 weeks after treatment.   The rash spreads or becomes infected (the area has red blisters or yellow-tan crust).  Document Released: 12/01/2005 Document Revised: 11/20/2011 Document Reviewed: 04/11/2009 ExitCare Patient Information 2012 [employer]. 

## 2012-03-11 NOTE — Progress Notes (Signed)
  Subjective:    Patient ID: Ralph [name], male    DOB: 1950-12-30, 61 y.o.   MRN: [id number]  HPI Acute visit Girlfriend dx empirically w/ scabies 4 weeks ago and responded to Eurax. The patient developed a almost generalized itching, 3 weeks ago went to a dermatologist c/o itching and a rash at the chest, was prescribed steroids. Rash @ the chest resolved but he is still itching  He did not  improve. He thinks he has scabies and request Eurax.  Past medical history  Hyperlipidemia dx 2007  Hypertension dx 2007  Normal stress test per patient, 2008.  Past surgical history  Negative  Review of Systems he has noted some "red bumps" at legs and arms  Also he self increased Toprol-XL to 50 mg daily because his BP was running high, since then ambulatory BPs around 120    Objective:   Physical Exam Alert, oriented x3, no apparent distress. Skin: He has a scattered < 1 mm, erythematous round lesions on the inner thighs and left forearm. The wrists, hands are not involved.     Assessment & Plan:  Mild rash and moderate to severe poor pruritus in the setting of recent exposure to scabies. Will treat empirically with Eurax.

## 2012-03-13 ENCOUNTER — Encounter: Payer: Self-pay | Admitting: Internal Medicine

## 2012-03-14 ENCOUNTER — Other Ambulatory Visit: Payer: Self-pay | Admitting: Internal Medicine

## 2012-03-15 NOTE — Telephone Encounter (Signed)
Refill done.  

## 2012-04-10 ENCOUNTER — Other Ambulatory Visit: Payer: Self-pay | Admitting: Internal Medicine

## 2012-04-12 NOTE — Telephone Encounter (Signed)
Refill done.  

## 2012-05-13 ENCOUNTER — Telehealth: Payer: Self-pay | Admitting: *Deleted

## 2012-05-13 MED ORDER — METOPROLOL SUCCINATE ER 50 MG PO TB24: 50.0000 mg | ORAL_TABLET | Freq: Every day | ORAL | Status: DC

## 2012-05-13 NOTE — Telephone Encounter (Signed)
Discuss with patient, Rx sent. 

## 2012-05-13 NOTE — Telephone Encounter (Signed)
Pt states that he is supposes to be on metoprolol 50 1 tab a day but the Rx for 1/2 tab keeps getting sent in to pharmacy.Ralph [name] Ralph KitchenPlease advise med is on list twice please verify how Pt to take med.

## 2012-05-13 NOTE — Telephone Encounter (Signed)
He takes 50 mg qd, correct the medication list please , send a new Rx if needed

## 2012-06-10 ENCOUNTER — Other Ambulatory Visit: Payer: Self-pay | Admitting: Internal Medicine

## 2012-06-10 NOTE — Telephone Encounter (Signed)
Refill done.  

## 2012-08-13 ENCOUNTER — Other Ambulatory Visit: Payer: Self-pay | Admitting: Internal Medicine

## 2012-08-13 NOTE — Telephone Encounter (Signed)
Refill done.  

## 2012-09-03 ENCOUNTER — Telehealth: Payer: Self-pay | Admitting: Internal Medicine

## 2012-09-03 NOTE — Telephone Encounter (Signed)
Caller: [name]/Patient; Patient Name: Ralph [name]; PCP: Willow Ora; Best Callback Phone Number: [phone];  Calling regarding cough and congestion, requesting a Z-pack. Leaving for [facility] at 1300 09/03/12, has cough with green mucus. Onset of symptoms 09/01/12. Took over the counter Delsym and Coricidin with some relief. Emergent signs and symptoms ruled out as per Upper Respiratory Infection protocol except for see in 24 hours due to productive cough with colored sputum. Offered appointment at 1345 with Dr. Drue Novel 09/03/12 declined and states he will deal with it and if no better follow up when he gets back. Per office orders no antibiotics called  in and patient aware.

## 2012-10-10 ENCOUNTER — Other Ambulatory Visit: Payer: Self-pay | Admitting: Internal Medicine

## 2012-10-11 NOTE — Telephone Encounter (Signed)
Refill done.  

## 2012-11-09 ENCOUNTER — Other Ambulatory Visit: Payer: Self-pay | Admitting: Internal Medicine

## 2012-11-09 NOTE — Telephone Encounter (Signed)
Refill done.  

## 2012-11-20 ENCOUNTER — Other Ambulatory Visit: Payer: Self-pay | Admitting: Internal Medicine

## 2012-11-22 ENCOUNTER — Telehealth: Payer: Self-pay | Admitting: *Deleted

## 2012-11-22 MED ORDER — ATORVASTATIN CALCIUM 40 MG PO TABS: 40.0000 mg | ORAL_TABLET | Freq: Every day | ORAL | Status: DC

## 2012-11-22 NOTE — Telephone Encounter (Signed)
Refill done.  

## 2012-11-22 NOTE — Telephone Encounter (Signed)
Spoke with pt, i explained to him that per Dr. Drue Novel he must schedule an OV to receive future refills. Pt states he understands & will call tomorrow to schedule a CPE with Dr. Drue Novel. Atorvastatin sent to pharmacy for 1 month.

## 2012-12-09 ENCOUNTER — Other Ambulatory Visit: Payer: Self-pay | Admitting: Internal Medicine

## 2012-12-09 ENCOUNTER — Encounter: Payer: Self-pay | Admitting: *Deleted

## 2012-12-09 NOTE — Telephone Encounter (Signed)
Refill done.  Pt made aware due for OV.

## 2012-12-29 ENCOUNTER — Other Ambulatory Visit: Payer: Self-pay | Admitting: Internal Medicine

## 2012-12-29 NOTE — Telephone Encounter (Signed)
Refill done. Pt has a future appt scheduled for 3.7.14.

## 2013-01-07 ENCOUNTER — Other Ambulatory Visit: Payer: Self-pay | Admitting: Internal Medicine

## 2013-01-07 NOTE — Telephone Encounter (Signed)
Refill done.  

## 2013-01-19 ENCOUNTER — Other Ambulatory Visit: Payer: Self-pay | Admitting: Internal Medicine

## 2013-01-19 NOTE — Telephone Encounter (Signed)
Pt has future appt 3.7.14.  Refill done.  

## 2013-02-18 ENCOUNTER — Other Ambulatory Visit: Payer: Self-pay | Admitting: Internal Medicine

## 2013-02-18 ENCOUNTER — Encounter: Payer: BC Managed Care – PPO | Admitting: Internal Medicine

## 2013-03-07 ENCOUNTER — Encounter: Payer: Self-pay | Admitting: Internal Medicine

## 2013-03-07 ENCOUNTER — Ambulatory Visit (INDEPENDENT_AMBULATORY_CARE_PROVIDER_SITE_OTHER): Payer: BC Managed Care – PPO | Admitting: Internal Medicine

## 2013-03-07 VITALS — BP 132/74 | HR 50 | Temp 97.9°F | Ht 68.5 in | Wt 183.0 lb

## 2013-03-07 DIAGNOSIS — Z0000 Encounter for general adult medical examination without abnormal findings: Secondary | ICD-10-CM

## 2013-03-07 DIAGNOSIS — H9313 Tinnitus, bilateral: Secondary | ICD-10-CM

## 2013-03-07 DIAGNOSIS — Z23 Encounter for immunization: Secondary | ICD-10-CM

## 2013-03-07 DIAGNOSIS — N529 Male erectile dysfunction, unspecified: Secondary | ICD-10-CM

## 2013-03-07 DIAGNOSIS — Z2911 Encounter for prophylactic immunotherapy for respiratory syncytial virus (RSV): Secondary | ICD-10-CM

## 2013-03-07 DIAGNOSIS — H9319 Tinnitus, unspecified ear: Secondary | ICD-10-CM | POA: Insufficient documentation

## 2013-03-07 MED ORDER — ALPRAZOLAM 0.5 MG PO TABS: 0.5000 mg | ORAL_TABLET | Freq: Every evening | ORAL | Status: DC | PRN

## 2013-03-07 NOTE — Assessment & Plan Note (Signed)
occ sx moderate at night, got his girlfriend's xanax and helped, request RF, done

## 2013-03-07 NOTE — Progress Notes (Signed)
  Subjective:    Patient ID: Ralph [name], male    DOB: [birth year], 62 y.o.   MRN: [id number]  HPI CPX  Past Medical History  Diagnosis Date  . Hyperlipidemia 2007  . Hypertension 2007  . Normal cardiac stress test 2008    per pt   Past Surgical History  Procedure Laterality Date  . No past surgeries     History   Social History  . Marital Status: Divorced    Spouse Name: N/A    Number of Children: 1  . Years of Education: N/A   Occupational History  . sales, hearing aid    Social History Main Topics  . Smoking status: Never Smoker   . Smokeless tobacco: Never Used  . Alcohol Use: Yes     Comment: socially  . Drug Use: No  . Sexually Active: Not on file   Other Topics Concern  . Not on file   Social History Narrative   Diet- healthy for the most part    Exercise: regular in the gym    Original from [place]    Has a girlfriend             Family History  Problem Relation Age of Onset  . Breast cancer Mother   . Bone cancer Mother   . Coronary artery disease Father     w/ first MI age 28, GF MI in the early 23s  . Prostate cancer Neg Hx   . Colon cancer Neg Hx   . Hypertension Sister   . Diabetes Neg Hx     Review of Systems Good compliance with all medicines. Ambulatory BPs 115/70. No nausea, vomiting, diarrhea or blood in the stools.  no GERD sx. Few weeks ago had a single episode of mild anterior chest pain at rest, actually bother him throughout the day, no further spells, no associated symptoms. No radiation. He goes to the gym and is very active ; has no chest pain all active. No dysuria or gross hematuria. He has a history of tinnitus, sometimes at night and gets severe when everything is quite. He try his girlfriend's Xanax and worked well, would like a prescription.     Objective:   Physical Exam General -- alert, well-developed, no apparent distress .   Neck --no thyromegaly  Lungs -- normal respiratory effort, no intercostal retractions, no  accessory muscle use, and normal breath sounds.   Heart-- normal rate, regular rhythm, no murmur, and no gallop.   Abdomen--soft, non-tender, no distention, no masses, no HSM, no guarding, and no rigidity.   Extremities-- no pretibial edema bilaterally Rectal-- No external abnormalities noted. Normal sphincter tone. No rectal masses or tenderness. Brown stool  Prostate:  Prostate gland firm and smooth, no enlargement, nodularity, tenderness, mass, asymmetry or induration. Neurologic-- alert & oriented X3 and strength normal in all extremities. Psych-- Cognition and judgment appear intact. Alert and cooperative with normal attention span and concentration.  not anxious appearing and not depressed appearing.      Assessment & Plan:

## 2013-03-07 NOTE — Assessment & Plan Note (Addendum)
Td 2012 zostavax today Cscope 01-2006, told next in 10 years Diet-exercise discussed + FH CAD, occ -chronic CP  rec observation . ASA qd Labs including a testosterone level per patient request although he's asymptomatic.  RTC 1 year  Chronic medical problems symptoms we will control, labs and refill medicines when necessary

## 2013-03-07 NOTE — Assessment & Plan Note (Signed)
Rarely needs meds 

## 2013-03-07 NOTE — Patient Instructions (Addendum)
Please come back fasting: FLP, CMP, CBC, TSH, PSA--- dx v70 Also testosterone (free-total)-- dx v70 ---- Next visit in one year, please make an appointment.  --- Check the  blood pressure 2 or 3 times a week, be sure it is between 110/60 and 140/85. If it is consistently higher or lower, let me know ---

## 2013-03-08 ENCOUNTER — Encounter: Payer: Self-pay | Admitting: Internal Medicine

## 2013-03-10 ENCOUNTER — Other Ambulatory Visit (INDEPENDENT_AMBULATORY_CARE_PROVIDER_SITE_OTHER): Payer: BC Managed Care – PPO

## 2013-03-10 ENCOUNTER — Encounter: Payer: Self-pay | Admitting: Lab

## 2013-03-10 DIAGNOSIS — Z0000 Encounter for general adult medical examination without abnormal findings: Secondary | ICD-10-CM

## 2013-03-10 LAB — CBC WITH DIFFERENTIAL/PLATELET
Basophils Relative: 0.6 % (ref 0.0–3.0)
Eosinophils Relative: 4.6 % (ref 0.0–5.0)
HCT: 40.1 % (ref 39.0–52.0)
Hemoglobin: 13.4 g/dL (ref 13.0–17.0)
Lymphs Abs: 1.2 10*3/uL (ref 0.7–4.0)
Monocytes Relative: 8.9 % (ref 3.0–12.0)
Neutro Abs: 2.6 10*3/uL (ref 1.4–7.7)
RBC: 4.42 Mil/uL (ref 4.22–5.81)
RDW: 12.9 % (ref 11.5–14.6)
WBC: 4.4 10*3/uL — ABNORMAL LOW (ref 4.5–10.5)

## 2013-03-10 LAB — COMPREHENSIVE METABOLIC PANEL
ALT: 22 U/L (ref 0–53)
BUN: 16 mg/dL (ref 6–23)
CO2: 27 mEq/L (ref 19–32)
Creatinine, Ser: 1 mg/dL (ref 0.4–1.5)
GFR: 84.68 mL/min (ref >60.00)
Total Bilirubin: 0.8 mg/dL (ref 0.3–1.2)

## 2013-03-10 LAB — LIPID PANEL
HDL: 44.6 mg/dL (ref >39.00)
Total CHOL/HDL Ratio: 3
Triglycerides: 45 mg/dL (ref 0.0–149.0)

## 2013-03-11 ENCOUNTER — Other Ambulatory Visit: Payer: Self-pay | Admitting: Internal Medicine

## 2013-03-11 NOTE — Telephone Encounter (Signed)
Refill done.  

## 2013-03-14 ENCOUNTER — Encounter: Payer: Self-pay | Admitting: *Deleted

## 2013-03-17 ENCOUNTER — Other Ambulatory Visit: Payer: Self-pay | Admitting: Internal Medicine

## 2013-03-17 NOTE — Telephone Encounter (Signed)
Refill done.  

## 2013-04-01 ENCOUNTER — Other Ambulatory Visit: Payer: Self-pay | Admitting: Internal Medicine

## 2013-04-04 NOTE — Telephone Encounter (Signed)
Refill done.  

## 2013-05-02 ENCOUNTER — Other Ambulatory Visit: Payer: Self-pay | Admitting: Internal Medicine

## 2013-05-03 NOTE — Telephone Encounter (Signed)
Refill done.  

## 2013-12-10 ENCOUNTER — Other Ambulatory Visit: Payer: Self-pay | Admitting: Internal Medicine

## 2013-12-12 NOTE — Telephone Encounter (Signed)
Atorvastatin refilled per protocol. JG//CMA 

## 2014-02-06 ENCOUNTER — Other Ambulatory Visit: Payer: Self-pay | Admitting: Internal Medicine

## 2014-03-08 ENCOUNTER — Other Ambulatory Visit: Payer: Self-pay | Admitting: Internal Medicine

## 2014-04-11 ENCOUNTER — Other Ambulatory Visit: Payer: Self-pay | Admitting: Internal Medicine

## 2014-04-14 ENCOUNTER — Other Ambulatory Visit: Payer: Self-pay | Admitting: Internal Medicine

## 2014-04-18 ENCOUNTER — Other Ambulatory Visit: Payer: BC Managed Care – PPO

## 2014-04-19 ENCOUNTER — Ambulatory Visit: Payer: BC Managed Care – PPO | Admitting: Internal Medicine

## 2014-04-19 ENCOUNTER — Other Ambulatory Visit: Payer: BC Managed Care – PPO

## 2014-04-21 ENCOUNTER — Encounter: Payer: Self-pay | Admitting: Internal Medicine

## 2014-04-21 ENCOUNTER — Ambulatory Visit (INDEPENDENT_AMBULATORY_CARE_PROVIDER_SITE_OTHER): Payer: BC Managed Care – PPO | Admitting: Internal Medicine

## 2014-04-21 VITALS — BP 158/70 | HR 56 | Temp 97.8°F | Wt 187.6 lb

## 2014-04-21 DIAGNOSIS — N529 Male erectile dysfunction, unspecified: Secondary | ICD-10-CM

## 2014-04-21 DIAGNOSIS — Z0000 Encounter for general adult medical examination without abnormal findings: Secondary | ICD-10-CM

## 2014-04-21 DIAGNOSIS — E785 Hyperlipidemia, unspecified: Secondary | ICD-10-CM

## 2014-04-21 DIAGNOSIS — I10 Essential (primary) hypertension: Secondary | ICD-10-CM

## 2014-04-21 LAB — LIPID PANEL
Cholesterol: 149 mg/dL (ref 0–200)
HDL: 48.4 mg/dL (ref >39.00)
LDL Cholesterol: 91 mg/dL (ref 0–99)
TRIGLYCERIDES: 50 mg/dL (ref 0.0–149.0)
Total CHOL/HDL Ratio: 3
VLDL: 10 mg/dL (ref 0.0–40.0)

## 2014-04-21 LAB — COMPREHENSIVE METABOLIC PANEL
ALT: 21 U/L (ref 0–53)
AST: 23 U/L (ref 0–37)
Albumin: 3.9 g/dL (ref 3.5–5.2)
Alkaline Phosphatase: 49 U/L (ref 39–117)
BILIRUBIN TOTAL: 0.9 mg/dL (ref 0.2–1.2)
BUN: 15 mg/dL (ref 6–23)
CALCIUM: 9.2 mg/dL (ref 8.4–10.5)
CHLORIDE: 106 meq/L (ref 96–112)
CO2: 27 meq/L (ref 19–32)
CREATININE: 0.9 mg/dL (ref 0.4–1.5)
GFR: 87.51 mL/min (ref >60.00)
Glucose, Bld: 100 mg/dL — ABNORMAL HIGH (ref 70–99)
Potassium: 4.2 mEq/L (ref 3.5–5.1)
Sodium: 141 mEq/L (ref 135–145)
Total Protein: 7 g/dL (ref 6.0–8.3)

## 2014-04-21 MED ORDER — PREDNISONE 10 MG PO TABS: ORAL_TABLET | ORAL | Status: DC

## 2014-04-21 MED ORDER — AZITHROMYCIN 250 MG PO TABS: ORAL_TABLET | ORAL | Status: DC

## 2014-04-21 NOTE — Assessment & Plan Note (Signed)
Excellent ambulatory BPs, see history of present illness. No change, labs

## 2014-04-21 NOTE — Assessment & Plan Note (Signed)
Wonders if he really needs to continue taking aspirin, some reports suggest is not necessary. We agreed to discontinue aspirin for now. Also likes his testosterone checked-- done

## 2014-04-21 NOTE — Progress Notes (Signed)
Subjective:    Patient ID: Ralph [name], male    DOB: [birth year], 63 y.o.   MRN: [id number]  DOS:  04/21/2014 Type of  visit: ROV High cholesterol, good medication compliance, he continue w/ a  healthy lifestyle Hypertension, good medication compliance, ambulatory BPs usually 110/80, he checks twice a week. Also for the last few days he has a lot of sinus and head pressure, feels stopped up. Some cough and clear runny nose. Thinks is allergies.   ROS No fever or chills No chest pain, difficulty breathing. Occasionally ankle edema bilaterally at the end of the day. No sneezing, itchy eyes or nose.  Past Medical History  Diagnosis Date  . Hyperlipidemia 2007  . Hypertension 2007  . Normal cardiac stress test 2008    per pt    Past Surgical History  Procedure Laterality Date  . No past surgeries      History   Social History  . Marital Status: Divorced    Spouse Name: N/A    Number of Children: 1  . Years of Education: N/A   Occupational History  . sales, hearing aid    Social History Main Topics  . Smoking status: Never Smoker   . Smokeless tobacco: Never Used  . Alcohol Use: Yes     Comment: socially  . Drug Use: No  . Sexual Activity: Not on file   Other Topics Concern  . Not on file   Social History Narrative   Original from [place]    Has a girlfriend                     Medication List       This list is accurate as of: 04/21/14  3:15 PM.  Always use your most recent med list.               ALPRAZolam 0.5 MG tablet  Commonly known as:  XANAX  Take 1 tablet (0.5 mg total) by mouth at bedtime as needed (tinnitus).     atorvastatin 40 MG tablet  Commonly known as:  LIPITOR  TAKE 1 TABLET BY MOUTH EVERY DAY     azithromycin 250 MG tablet  Commonly known as:  ZITHROMAX Z-PAK  As directed     fluticasone 50 MCG/ACT nasal spray  Commonly known as:  FLONASE  USE 2 SPRAYS IN EACH NOSTRIL EVERY DAY AS NEEDED     losartan 100 MG tablet    Commonly known as:  COZAAR  TAKE 1 TABLET BY MOUTH EVERY DAY **NEEDS OV**     metoprolol succinate 50 MG 24 hr tablet  Commonly known as:  TOPROL-XL  TAKE 1 TABLET DAILY. DUE FOR ANNUAL CHECK UP > [phone] PLEASE SCHEDULE     predniSONE 10 MG tablet  Commonly known as:  DELTASONE  4 tablets x 2 days, 3 tabs x 2 days, 2 tabs x 2 days, 1 tab x 2 days     sildenafil 100 MG tablet  Commonly known as:  VIAGRA  Take 0.5-1 tablets (50-100 mg total) by mouth daily as needed for erectile dysfunction.           Objective:   Physical Exam BP 158/70  Pulse 56  Temp(Src) 97.8 F (36.6 C) (Oral)  Wt 187 lb 9.6 oz (85.095 kg)  SpO2 99% General -- alert, well-developed, NAD.    HEENT-- Not pale. TMs B bulge, L>R, no red, no d/c; throat symmetric, no redness or discharge. Face symmetric,  sinuses not tender to palpation. Nose slt congested.  Lungs -- normal respiratory effort, no intercostal retractions, no accessory muscle use, and normal breath sounds.  Heart-- normal rate, regular rhythm, no murmur.   Extremities-- no pretibial or ankle edema bilaterally  Neurologic--  alert & oriented X3. Speech normal, gait normal, strength normal in all extremities.  Psych-- Cognition and judgment appear intact. Cooperative with normal attention span and concentration. No anxious or depressed appearing.        Assessment & Plan:

## 2014-04-21 NOTE — Patient Instructions (Signed)
Get your blood work before you leave   For allergies:  Continue with Flonase, take OTC Claritin 10 mg one tablet daily. Take prednisone as prescribed for a few days. If the respiratory symptoms increase, you have fever, chills or not better : Use the antibiotic Z-Pak.   Next visit is for a physical exam in 6 months , fasting Please make an appointment

## 2014-04-21 NOTE — Assessment & Plan Note (Addendum)
Good medication compliance, labs

## 2014-04-21 NOTE — Progress Notes (Signed)
Pre visit review using our clinic review tool, if applicable. No additional management support is needed unless otherwise documented below in the visit note. 

## 2014-04-22 ENCOUNTER — Telehealth: Payer: Self-pay | Admitting: Internal Medicine

## 2014-04-22 NOTE — Telephone Encounter (Signed)
Relevant patient education mailed to patient.  

## 2014-04-24 LAB — TESTOSTERONE, FREE, TOTAL, SHBG
Sex Hormone Binding: 24 nmol/L (ref 13–71)
TESTOSTERONE FREE: 49.9 pg/mL (ref 47.0–244.0)
Testosterone-% Free: 2.3 % (ref 1.6–2.9)
Testosterone: 219 ng/dL — ABNORMAL LOW (ref 300–890)

## 2014-04-27 ENCOUNTER — Encounter: Payer: Self-pay | Admitting: *Deleted

## 2014-06-14 ENCOUNTER — Other Ambulatory Visit: Payer: Self-pay | Admitting: Internal Medicine

## 2014-10-25 ENCOUNTER — Ambulatory Visit (INDEPENDENT_AMBULATORY_CARE_PROVIDER_SITE_OTHER): Payer: BC Managed Care – PPO | Admitting: Internal Medicine

## 2014-10-25 ENCOUNTER — Ambulatory Visit (INDEPENDENT_AMBULATORY_CARE_PROVIDER_SITE_OTHER): Payer: BC Managed Care – PPO

## 2014-10-25 ENCOUNTER — Encounter: Payer: Self-pay | Admitting: Internal Medicine

## 2014-10-25 VITALS — BP 128/62 | HR 53 | Temp 97.4°F | Wt 185.5 lb

## 2014-10-25 DIAGNOSIS — Z23 Encounter for immunization: Secondary | ICD-10-CM

## 2014-10-25 DIAGNOSIS — Z0000 Encounter for general adult medical examination without abnormal findings: Secondary | ICD-10-CM

## 2014-10-25 NOTE — Assessment & Plan Note (Addendum)
Td 2012 zostavax--2012 Flu shot today  DRE and PSA within normal 02-2013, recheck in 2016 Cscope 01-2006, told next in 10 years Diet-exercise -- doing well with lifestyle Has a + FH CAD  EKG today-- sinus brady

## 2014-10-25 NOTE — Progress Notes (Signed)
Pre visit review using our clinic review tool, if applicable. No additional management support is needed unless otherwise documented below in the visit note. 

## 2014-10-25 NOTE — Patient Instructions (Signed)
Get your blood work before you leave     Please come back to the office in 6 to 8 months  for a routine check up   Come back fasting

## 2014-10-25 NOTE — Progress Notes (Signed)
Subjective:    Patient ID: Ralph [name], male    DOB: September 01, 1951, 63 y.o.   MRN: [id number]  DOS:  10/25/2014 Type of visit - description : cpx Interval history: In general feeling well, good compliance w/ medications.   ROS Diet-Exercise: active, eats mostly healthy No  CP, SOB Denies  nausea, vomiting diarrhea, blood in the stools (-) cough, sputum production (-) wheezing, chest congestion No dysuria, gross hematuria, difficulty urinating  No anxiety, depression No headaches. Denies dizziness       Past Medical History  Diagnosis Date  . Hyperlipidemia 2007  . Hypertension 2007  . Normal cardiac stress test 2008    per pt    Past Surgical History  Procedure Laterality Date  . No past surgeries      History   Social History  . Marital Status: Divorced    Spouse Name: N/A    Number of Children: 1  . Years of Education: N/A   Occupational History  . sales, hearing aid    Social History Main Topics  . Smoking status: Never Smoker   . Smokeless tobacco: Never Used  . Alcohol Use: Yes     Comment: socially  . Drug Use: No  . Sexual Activity: Not on file   Other Topics Concern  . Not on file   Social History Narrative   Original from [place] w/  girlfriend                  Family History  Problem Relation Age of Onset  . Breast cancer Mother   . Bone cancer Mother   . Coronary artery disease Father     w/ first MI age 12, GF MI in the early 44s  . Prostate cancer Neg Hx   . Colon cancer Neg Hx   . Hypertension Sister   . Diabetes Neg Hx     Family History  Problem Relation Age of Onset  . Breast cancer Mother   . Bone cancer Mother   . Coronary artery disease Father     w/ first MI age 67, GF MI in the early 56s  . Prostate cancer Neg Hx   . Colon cancer Neg Hx   . Hypertension Sister   . Diabetes Neg Hx       Medication List       This list is accurate as of: 10/25/14 11:59 PM.  Always use your most recent med list.                 ALPRAZolam 0.5 MG tablet  Commonly known as:  XANAX  Take 1 tablet (0.5 mg total) by mouth at bedtime as needed (tinnitus).     atorvastatin 40 MG tablet  Commonly known as:  LIPITOR  TAKE 1 TABLET BY MOUTH EVERY DAY     fluticasone 50 MCG/ACT nasal spray  Commonly known as:  FLONASE  SPRAY TWICE IN EACH NOSTRIL EVERY DAY AS NEEDED     losartan 100 MG tablet  Commonly known as:  COZAAR  TAKE 1 TABLET BY MOUTH EVERY DAY     metoprolol succinate 50 MG 24 hr tablet  Commonly known as:  TOPROL-XL  Take 1 tablet daily.     sildenafil 100 MG tablet  Commonly known as:  VIAGRA  Take 0.5-1 tablets (50-100 mg total) by mouth daily as needed for erectile dysfunction.           Objective:  Physical Exam BP 128/62 mmHg  Pulse 53  Temp(Src) 97.4 F (36.3 C) (Oral)  Wt 185 lb 8 oz (84.142 kg)  SpO2 99% General -- alert, well-developed, NAD.  Neck --no thyromegaly , normal carotid pulse HEENT-- Not pale.   Lungs -- normal respiratory effort, no intercostal retractions, no accessory muscle use, and normal breath sounds.  Heart-- normal rate, regular rhythm, no murmur.  Abdomen-- Not distended, good bowel sounds,soft, non-tender. Extremities-- no pretibial edema bilaterally  Neurologic--  alert & oriented X3. Speech normal, gait appropriate for age, strength symmetric and appropriate for age.  Psych-- Cognition and judgment appear intact. Cooperative with normal attention span and concentration. No anxious or depressed appearing.      Assessment & Plan:

## 2014-10-26 LAB — BASIC METABOLIC PANEL
BUN: 18 mg/dL (ref 6–23)
CO2: 19 meq/L (ref 19–32)
CREATININE: 1 mg/dL (ref 0.4–1.5)
Calcium: 9.1 mg/dL (ref 8.4–10.5)
Chloride: 106 mEq/L (ref 96–112)
GFR: 84.22 mL/min (ref >60.00)
Glucose, Bld: 99 mg/dL (ref 70–99)
Potassium: 3.8 mEq/L (ref 3.5–5.1)
Sodium: 141 mEq/L (ref 135–145)

## 2014-10-26 LAB — CBC WITH DIFFERENTIAL/PLATELET
Basophils Absolute: 0 10*3/uL (ref 0.0–0.1)
Basophils Relative: 0.4 % (ref 0.0–3.0)
EOS PCT: 4.1 % (ref 0.0–5.0)
Eosinophils Absolute: 0.2 10*3/uL (ref 0.0–0.7)
HEMATOCRIT: 41.5 % (ref 39.0–52.0)
Hemoglobin: 14 g/dL (ref 13.0–17.0)
Lymphocytes Relative: 27.8 % (ref 12.0–46.0)
Lymphs Abs: 1.3 10*3/uL (ref 0.7–4.0)
MCHC: 33.7 g/dL (ref 30.0–36.0)
MCV: 90.6 fl (ref 78.0–100.0)
Monocytes Absolute: 0.4 10*3/uL (ref 0.1–1.0)
Monocytes Relative: 9 % (ref 3.0–12.0)
NEUTROS ABS: 2.8 10*3/uL (ref 1.4–7.7)
Neutrophils Relative %: 58.7 % (ref 43.0–77.0)
Platelets: 206 10*3/uL (ref 150.0–400.0)
RBC: 4.59 Mil/uL (ref 4.22–5.81)
RDW: 12.8 % (ref 11.5–15.5)
WBC: 4.8 10*3/uL (ref 4.0–10.5)

## 2014-10-26 LAB — AST: AST: 24 U/L (ref 0–37)

## 2014-10-26 LAB — ALT: ALT: 22 U/L (ref 0–53)

## 2015-01-22 ENCOUNTER — Other Ambulatory Visit: Payer: Self-pay | Admitting: Internal Medicine

## 2015-03-03 ENCOUNTER — Other Ambulatory Visit: Payer: Self-pay | Admitting: Internal Medicine

## 2015-04-22 ENCOUNTER — Other Ambulatory Visit: Payer: Self-pay | Admitting: Internal Medicine

## 2015-05-07 DIAGNOSIS — C4491 Basal cell carcinoma of skin, unspecified: Secondary | ICD-10-CM

## 2015-05-07 HISTORY — DX: Basal cell carcinoma of skin, unspecified: C44.91

## 2015-06-26 ENCOUNTER — Encounter: Payer: Self-pay | Admitting: Internal Medicine

## 2015-06-26 ENCOUNTER — Ambulatory Visit (INDEPENDENT_AMBULATORY_CARE_PROVIDER_SITE_OTHER): Payer: BLUE CROSS/BLUE SHIELD | Admitting: Internal Medicine

## 2015-06-26 VITALS — BP 126/84 | HR 49 | Temp 97.6°F | Ht 69.0 in | Wt 181.4 lb

## 2015-06-26 DIAGNOSIS — I10 Essential (primary) hypertension: Secondary | ICD-10-CM | POA: Diagnosis not present

## 2015-06-26 DIAGNOSIS — E785 Hyperlipidemia, unspecified: Secondary | ICD-10-CM

## 2015-06-26 DIAGNOSIS — Z0000 Encounter for general adult medical examination without abnormal findings: Secondary | ICD-10-CM

## 2015-06-26 LAB — BASIC METABOLIC PANEL
BUN: 17 mg/dL (ref 6–23)
CALCIUM: 9.3 mg/dL (ref 8.4–10.5)
CO2: 30 mEq/L (ref 19–32)
Chloride: 104 mEq/L (ref 96–112)
Creatinine, Ser: 0.96 mg/dL (ref 0.40–1.50)
GFR: 84.04 mL/min (ref >60.00)
Glucose, Bld: 102 mg/dL — ABNORMAL HIGH (ref 70–99)
Potassium: 4.2 mEq/L (ref 3.5–5.1)
Sodium: 139 mEq/L (ref 135–145)

## 2015-06-26 LAB — LIPID PANEL
Cholesterol: 150 mg/dL (ref 0–200)
HDL: 50.9 mg/dL (ref >39.00)
LDL Cholesterol: 87 mg/dL (ref 0–99)
NONHDL: 99.1
Total CHOL/HDL Ratio: 3
Triglycerides: 63 mg/dL (ref 0.0–149.0)
VLDL: 12.6 mg/dL (ref 0.0–40.0)

## 2015-06-26 MED ORDER — ATORVASTATIN CALCIUM 40 MG PO TABS: 20.0000 mg | ORAL_TABLET | Freq: Every day | ORAL | Status: DC

## 2015-06-26 MED ORDER — LOSARTAN POTASSIUM 100 MG PO TABS: 100.0000 mg | ORAL_TABLET | Freq: Every day | ORAL | Status: DC

## 2015-06-26 MED ORDER — METOPROLOL SUCCINATE ER 50 MG PO TB24: ORAL_TABLET | ORAL | Status: DC

## 2015-06-26 NOTE — Assessment & Plan Note (Signed)
Patient currently taking half metoprolol as he was slightly dizzy when he stands up. BP remains well controlled Plan: BMP, continue losartan 100 mg, metoprolol XL 50 half tablet daily.

## 2015-06-26 NOTE — Patient Instructions (Signed)
Get your blood work before you leave     Check the  blood pressure 2 or 3 times a month    Be sure your blood pressure is between 110/65 and  145/85.  if it is consistently higher or lower, let me know

## 2015-06-26 NOTE — Progress Notes (Signed)
Subjective:    Patient ID: Ralph [name], male    DOB: 09-Mar-1951, 64 y.o.   MRN: [id number]  DOS:  06/26/2015 Type of visit - description : Routine visit Interval history: Hypertension, the patient started to eat healthier, he continued to be extremely active, lost some weight. Noted some dizziness when he stood up, dizziness lasted a few seconds, self decreased metoprolol to half tablet. High cholesterol, good compliance with medication, due for labs, wonders if he needs to continue taking statins  Review of Systems No chest pain, difficulty breathing or lower extremity edema No nausea, vomiting, diarrhea  Past Medical History  Diagnosis Date  . Hyperlipidemia 2007  . Hypertension 2007  . Normal cardiac stress test 2008    per pt    Past Surgical History  Procedure Laterality Date  . No past surgeries      History   Social History  . Marital Status: Divorced    Spouse Name: N/A  . Number of Children: 1  . Years of Education: N/A   Occupational History  . sales, hearing aid    Social History Main Topics  . Smoking status: Never Smoker   . Smokeless tobacco: Never Used  . Alcohol Use: Yes     Comment: socially  . Drug Use: No  . Sexual Activity: Not on file   Other Topics Concern  . Not on file   Social History Narrative   Original from [place] w/  girlfriend                     Medication List       This list is accurate as of: 06/26/15  2:29 PM.  Always use your most recent med list.               ALPRAZolam 0.5 MG tablet  Commonly known as:  XANAX  Take 1 tablet (0.5 mg total) by mouth at bedtime as needed (tinnitus).     atorvastatin 40 MG tablet  Commonly known as:  LIPITOR  Take 0.5 tablets (20 mg total) by mouth daily.     fluticasone 50 MCG/ACT nasal spray  Commonly known as:  FLONASE  Place 2 sprays into both nostrils daily as needed.     losartan 100 MG tablet  Commonly known as:  COZAAR  Take 1 tablet (100 mg total) by  mouth daily.     metoprolol succinate 50 MG 24 hr tablet  Commonly known as:  TOPROL-XL  Take 1/2 tablet by mouth daily. Take with or immediately following a meal.     sildenafil 100 MG tablet  Commonly known as:  VIAGRA  Take 0.5-1 tablets (50-100 mg total) by mouth daily as needed for erectile dysfunction.           Objective:   Physical Exam BP 126/84 mmHg  Pulse 49  Temp(Src) 97.6 F (36.4 C) (Oral)  Ht 5\' 9"  (1.753 m)  Wt 181 lb 6 oz (82.271 kg)  BMI 26.77 kg/m2  SpO2 99% General:   Well developed, well nourished . NAD.  HEENT:  Normocephalic . Face symmetric, atraumatic Lungs:  CTA B Normal respiratory effort, no intercostal retractions, no accessory muscle use. Heart: RRR,  no murmur.  No pretibial edema bilaterally  Skin: Not pale. Not jaundice Neurologic:  alert & oriented X3.  Speech normal, gait appropriate for age and unassisted Psych--  Cognition and judgment appear intact.  Cooperative with normal attention span and  concentration.  Behavior appropriate. No anxious or depressed appearing.      Assessment & Plan:

## 2015-06-26 NOTE — Progress Notes (Signed)
Pre visit review using our clinic review tool, if applicable. No additional management support is needed unless otherwise documented below in the visit note. 

## 2015-06-27 NOTE — Assessment & Plan Note (Signed)
Has been on Lipitor for a while, wonder if he needs to continue with statin. The patient has a + family history of heart disease, hypertension, high cholesterol, last LDL less than 100. He has a very healthy lifestyle actually better in the last few months. Plan: Check FLP, decrease Lipitor to 20 mg, LDL goal < 130, ~ 100. Recheck a lipid panel on return to the office.

## 2015-12-22 ENCOUNTER — Other Ambulatory Visit: Payer: Self-pay | Admitting: Internal Medicine

## 2016-02-14 ENCOUNTER — Telehealth: Payer: Self-pay | Admitting: Internal Medicine

## 2016-02-22 ENCOUNTER — Other Ambulatory Visit: Payer: Self-pay | Admitting: Internal Medicine

## 2016-02-29 MED ORDER — FLUTICASONE PROPIONATE 50 MCG/ACT NA SUSP: 2.0000 | Freq: Every day | NASAL | Status: DC | PRN

## 2016-02-29 NOTE — Telephone Encounter (Signed)
Rx sent 

## 2016-02-29 NOTE — Addendum Note (Signed)
Addended byDamita Dunnings D on: 02/29/2016 04:13 PM   Modules accepted: Orders

## 2016-02-29 NOTE — Telephone Encounter (Signed)
Pt needing refill on flonase but does not need to order the atorvastatin or metoprolol (requested by pharmacy). He is out of flonase though. Please send to [id number] - [place], [address] today if possible.

## 2016-03-10 ENCOUNTER — Ambulatory Visit (INDEPENDENT_AMBULATORY_CARE_PROVIDER_SITE_OTHER): Payer: BLUE CROSS/BLUE SHIELD | Admitting: Internal Medicine

## 2016-03-10 ENCOUNTER — Encounter: Payer: Self-pay | Admitting: Internal Medicine

## 2016-03-10 VITALS — BP 122/70 | HR 43 | Temp 98.2°F | Ht 69.0 in | Wt 187.5 lb

## 2016-03-10 DIAGNOSIS — Z114 Encounter for screening for human immunodeficiency virus [HIV]: Secondary | ICD-10-CM | POA: Diagnosis not present

## 2016-03-10 DIAGNOSIS — Z0000 Encounter for general adult medical examination without abnormal findings: Secondary | ICD-10-CM

## 2016-03-10 DIAGNOSIS — Z125 Encounter for screening for malignant neoplasm of prostate: Secondary | ICD-10-CM | POA: Diagnosis not present

## 2016-03-10 DIAGNOSIS — Z09 Encounter for follow-up examination after completed treatment for conditions other than malignant neoplasm: Secondary | ICD-10-CM

## 2016-03-10 NOTE — Progress Notes (Signed)
Pre visit review using our clinic review tool, if applicable. No additional management support is needed unless otherwise documented below in the visit note. 

## 2016-03-10 NOTE — Patient Instructions (Signed)
GO TO THE LAB :      Get the blood work     GO TO THE FRONT DESK Schedule your next appointment for a  physical When?   1 year Fasting?  Yes

## 2016-03-10 NOTE — Progress Notes (Signed)
Subjective:    Patient ID: Ralph [name], male    DOB: September 03, 1951, 65 y.o.   MRN: [id number]  DOS:  03/10/2016 Type of visit - description : CPX Interval history: No concerns    Review of Systems  Constitutional: No fever. No chills. No unexplained wt changes. No unusual sweats  HEENT: No dental problems, no ear discharge, no facial swelling, no voice changes. No eye discharge, no eye  redness , no  intolerance to light   Respiratory: No wheezing , no  difficulty breathing. No cough , no mucus production  Cardiovascular: No CP, no leg swelling , no  Palpitations  GI: no nausea, no vomiting, no diarrhea , no  abdominal pain.  No blood in the stools. No dysphagia, no odynophagia    Endocrine: No polyphagia, no polyuria , no polydipsia  GU: No dysuria, gross hematuria, difficulty urinating. No urinary urgency, no frequency.  Musculoskeletal: No joint swellings or unusual aches or pains  Skin: No change in the color of the skin, palor , no  Rash  Allergic, immunologic: No environmental allergies , no  food allergies  Neurological: No dizziness no  syncope. No headaches. No diplopia, no slurred, no slurred speech, no motor deficits, no facial  Numbness  Hematological: No enlarged lymph nodes, no easy bruising , no unusual bleedings  Psychiatry: No suicidal ideas, no hallucinations, no beavior problems, no confusion.  No unusual/severe anxiety, no depression  Past Medical History  Diagnosis Date  . Hyperlipidemia 2007  . Hypertension 2007  . Normal cardiac stress test 2008    per pt    Past Surgical History  Procedure Laterality Date  . No past surgeries      Social History   Social History  . Marital Status: Divorced    Spouse Name: N/A  . Number of Children: 1  . Years of Education: N/A   Occupational History  . sales, hearing aid    Social History Main Topics  . Smoking status: Never Smoker   . Smokeless tobacco: Never Used  . Alcohol Use: Yes   Comment: socially  . Drug Use: No  . Sexual Activity: Not on file   Other Topics Concern  . Not on file   Social History Narrative   Original from [place] w/  girlfriend                  Family History  Problem Relation Age of Onset  . Breast cancer Mother   . Bone cancer Mother   . Coronary artery disease Father     w/ first MI age 30, GF MI in the early 27s  . Prostate cancer Neg Hx   . Colon cancer Neg Hx   . Hypertension Sister   . Diabetes Neg Hx        Medication List       This list is accurate as of: 03/10/16 11:59 PM.  Always use your most recent med list.               ALPRAZolam 0.5 MG tablet  Commonly known as:  XANAX  Take 1 tablet (0.5 mg total) by mouth at bedtime as needed (tinnitus).     aspirin 81 MG tablet  Take 81 mg by mouth daily.     atorvastatin 40 MG tablet  Commonly known as:  LIPITOR  Take 0.5 tablets (20 mg total) by mouth daily.     fluticasone 50 MCG/ACT nasal spray  Commonly known as:  FLONASE  Place 2 sprays into both nostrils daily as needed for allergies or rhinitis.     losartan 100 MG tablet  Commonly known as:  COZAAR  Take 1 tablet (100 mg total) by mouth daily.     metoprolol succinate 50 MG 24 hr tablet  Commonly known as:  TOPROL-XL  Take 1/2 tablet by mouth daily.           Objective:   Physical Exam BP 122/70 mmHg  Pulse 43  Temp(Src) 98.2 F (36.8 C) (Oral)  Ht 5\' 9"  (1.753 m)  Wt 187 lb 8 oz (85.049 kg)  BMI 27.68 kg/m2  SpO2 98%  General:   Well developed, well nourished . NAD.  Neck: No  thyromegaly , normal carotid pulse HEENT:  Normocephalic . Face symmetric, atraumatic Lungs:  CTA B Normal respiratory effort, no intercostal retractions, no accessory muscle use. Heart: RRR,  no murmur.  No pretibial edema bilaterally  Abdomen:  Not distended, soft, non-tender. No rebound or rigidity.   Skin: Exposed areas without rash. Not pale. Not jaundice Rectal:  External abnormalities:  none. Normal sphincter tone. No rectal masses or tenderness.  Stool brown  Prostate: Prostate gland firm and smooth, no enlargement, nodularity, tenderness, mass, asymmetry or induration.  Neurologic:  alert & oriented X3.  Speech normal, gait appropriate for age and unassisted Strength symmetric and appropriate for age.  Psych: Cognition and judgment appear intact.  Cooperative with normal attention span and concentration.  Behavior appropriate. No anxious or depressed appearing.    Assessment & Plan:   Assessment HTN Hyperlipidemia Tinnitus (takes xanas very rarely) E.D. Normal stress test 2008 per patient + FH CAD: Aspirin daily  Plan: HTN: Seems well-controlled, continue losartan, metoprolol Hyperlipidemia: Continue Lipitor, labs. RTC one year

## 2016-03-10 NOTE — Assessment & Plan Note (Addendum)
Td 2012; zostavax--2012  DRE normal, check a   PSA  Cscope 01-2006,due for a cscope, got a letter from [facility] already Diet-exercise -- doing well with lifestyle Labs: BMP, AST, ALT, FLP, CBC, TSH, PSA, HIV

## 2016-03-11 DIAGNOSIS — Z09 Encounter for follow-up examination after completed treatment for conditions other than malignant neoplasm: Secondary | ICD-10-CM | POA: Insufficient documentation

## 2016-03-11 LAB — AST: AST: 21 U/L (ref 0–37)

## 2016-03-11 LAB — CBC WITH DIFFERENTIAL/PLATELET
BASOS PCT: 0.6 % (ref 0.0–3.0)
Basophils Absolute: 0 10*3/uL (ref 0.0–0.1)
EOS ABS: 0.2 10*3/uL (ref 0.0–0.7)
EOS PCT: 4 % (ref 0.0–5.0)
HEMATOCRIT: 40.8 % (ref 39.0–52.0)
HEMOGLOBIN: 13.8 g/dL (ref 13.0–17.0)
LYMPHS PCT: 27.5 % (ref 12.0–46.0)
Lymphs Abs: 1.5 10*3/uL (ref 0.7–4.0)
MCHC: 33.8 g/dL (ref 30.0–36.0)
MCV: 89.2 fl (ref 78.0–100.0)
Monocytes Absolute: 0.5 10*3/uL (ref 0.1–1.0)
Monocytes Relative: 8.3 % (ref 3.0–12.0)
Neutro Abs: 3.3 10*3/uL (ref 1.4–7.7)
Neutrophils Relative %: 59.6 % (ref 43.0–77.0)
Platelets: 200 10*3/uL (ref 150.0–400.0)
RBC: 4.57 Mil/uL (ref 4.22–5.81)
RDW: 13 % (ref 11.5–15.5)
WBC: 5.5 10*3/uL (ref 4.0–10.5)

## 2016-03-11 LAB — LIPID PANEL
CHOLESTEROL: 167 mg/dL (ref 0–200)
HDL: 54.6 mg/dL (ref >39.00)
LDL Cholesterol: 96 mg/dL (ref 0–99)
NonHDL: 112.47
Total CHOL/HDL Ratio: 3
Triglycerides: 83 mg/dL (ref 0.0–149.0)
VLDL: 16.6 mg/dL (ref 0.0–40.0)

## 2016-03-11 LAB — BASIC METABOLIC PANEL
BUN: 17 mg/dL (ref 6–23)
CO2: 28 mEq/L (ref 19–32)
CREATININE: 0.88 mg/dL (ref 0.40–1.50)
Calcium: 9.4 mg/dL (ref 8.4–10.5)
Chloride: 106 mEq/L (ref 96–112)
GFR: 92.71 mL/min (ref >60.00)
GLUCOSE: 88 mg/dL (ref 70–99)
POTASSIUM: 4.7 meq/L (ref 3.5–5.1)
Sodium: 142 mEq/L (ref 135–145)

## 2016-03-11 LAB — ALT: ALT: 17 U/L (ref 0–53)

## 2016-03-11 LAB — TSH: TSH: 2.42 u[IU]/mL (ref 0.35–4.50)

## 2016-03-11 LAB — HIV ANTIBODY (ROUTINE TESTING W REFLEX): HIV 1&2 Ab, 4th Generation: NONREACTIVE

## 2016-03-11 LAB — PSA: PSA: 0.48 ng/mL (ref 0.10–4.00)

## 2016-03-11 NOTE — Assessment & Plan Note (Signed)
HTN: Seems well-controlled, continue losartan, metoprolol Hyperlipidemia: Continue Lipitor, labs. RTC one year

## 2016-03-14 ENCOUNTER — Other Ambulatory Visit: Payer: Self-pay | Admitting: Internal Medicine

## 2016-04-29 ENCOUNTER — Other Ambulatory Visit: Payer: Self-pay | Admitting: Internal Medicine

## 2016-07-20 ENCOUNTER — Other Ambulatory Visit: Payer: Self-pay | Admitting: Internal Medicine

## 2016-11-26 ENCOUNTER — Other Ambulatory Visit: Payer: Self-pay | Admitting: Internal Medicine

## 2016-12-20 ENCOUNTER — Other Ambulatory Visit: Payer: Self-pay | Admitting: Internal Medicine

## 2017-03-17 ENCOUNTER — Ambulatory Visit (INDEPENDENT_AMBULATORY_CARE_PROVIDER_SITE_OTHER): Payer: BLUE CROSS/BLUE SHIELD | Admitting: Internal Medicine

## 2017-03-17 ENCOUNTER — Encounter: Payer: Self-pay | Admitting: Internal Medicine

## 2017-03-17 VITALS — BP 124/72 | HR 47 | Temp 98.2°F | Resp 14 | Ht 69.0 in | Wt 188.1 lb

## 2017-03-17 DIAGNOSIS — Z0000 Encounter for general adult medical examination without abnormal findings: Secondary | ICD-10-CM | POA: Diagnosis not present

## 2017-03-17 DIAGNOSIS — Z1159 Encounter for screening for other viral diseases: Secondary | ICD-10-CM

## 2017-03-17 NOTE — Assessment & Plan Note (Addendum)
--  Td 2012; zostavax--2012 --Prostate ca screening: 2017 DRE and PSA normal  --Cscope 01-2006,due for a cscope, did not schedule a cscope last year, declined  referral, states he will call. --Diet-exercise-doing great with lifestyle. --Labs:   CMP, FLP, CBC, PSA (at the patient request) and a hep C

## 2017-03-17 NOTE — Progress Notes (Signed)
Pre visit review using our clinic review tool, if applicable. No additional management support is needed unless otherwise documented below in the visit note. 

## 2017-03-17 NOTE — Patient Instructions (Addendum)
GO TO THE LAB : Get the blood work     GO TO THE FRONT DESK Schedule your next appointment for a  physical exam in one year  Check the  blood pressure 2 or 3 times a month   Be sure your blood pressure is between 110/65 and  135/85. If it is consistently higher or lower, let me know    

## 2017-03-17 NOTE — Progress Notes (Signed)
Subjective:    Patient ID: Ralph [name], male    DOB: 05/28/1951, 66 y.o.   MRN: [id number]  DOS:  03/17/2017 Type of visit - description : cpx Interval history: In general doing well, he remains active   Review of Systems 2 weeks ago, he check his BP, was 160, he realized that he was stressed out and had gained some weight; also was taking only half metoprolol. Since then he is taking a full metoprolol tablet, changed his diet, has lost weight and his BP this morning was 114. Occasionally has ankle edema bilaterally in the end of the day. Would like his testosterone checked, denies any fatigue, poor erections or decreased libido. "Some of my  friends take it and they felt great". History of tinnitus, no getting worse, no decreased hearing.   Other than above, a 14 point review of systems is negative    Past Medical History:  Diagnosis Date  . Hyperlipidemia 2007  . Hypertension 2007  . Normal cardiac stress test 2008   per pt    Past Surgical History:  Procedure Laterality Date  . NO PAST SURGERIES      Social History   Social History  . Marital status: Divorced    Spouse name: N/A  . Number of children: 1  . Years of education: N/A   Occupational History  . sales, hearing aid    Social History Main Topics  . Smoking status: Never Smoker  . Smokeless tobacco: Never Used  . Alcohol use Yes     Comment: socially  . Drug use: No  . Sexual activity: Not on file   Other Topics Concern  . Not on file   Social History Narrative   Original from [place] w/  girlfriend part time                  Family History  Problem Relation Age of Onset  . Breast cancer Mother   . Bone cancer Mother   . Coronary artery disease Father     w/ first MI age 36, GF MI in the early 98s  . Hypertension Sister   . Prostate cancer Neg Hx   . Colon cancer Neg Hx   . Diabetes Neg Hx     Allergies as of 03/17/2017      Reactions   Penicillins       Medication List       Accurate as of 03/17/17 11:59 PM. Always use your most recent med list.          aspirin 81 MG tablet Take 81 mg by mouth daily.   atorvastatin 40 MG tablet Commonly known as:  LIPITOR Take 0.5 tablets (20 mg total) by mouth daily.   fluticasone 50 MCG/ACT nasal spray Commonly known as:  FLONASE Place 2 sprays into both nostrils daily as needed for allergies or rhinitis.   losartan 100 MG tablet Commonly known as:  COZAAR Take 1 tablet (100 mg total) by mouth daily.   metoprolol succinate 50 MG 24 hr tablet Commonly known as:  TOPROL-XL Take 1 tablet (50 mg total) by mouth daily.          Objective:   Physical Exam BP 124/72 (BP Location: Left Arm, Patient Position: Sitting, Cuff Size: Normal)   Pulse (!) 47   Temp 98.2 F (36.8 C) (Oral)   Resp 14   Ht 5\' 9"  (1.753 m)   Wt 188 lb 2 oz (  85.3 kg)   SpO2 97%   BMI 27.78 kg/m   General:   Well developed, well nourished . NAD.  Neck: No  thyromegaly  HEENT:  Normocephalic . Face symmetric, atraumatic Lungs:  CTA B Normal respiratory effort, no intercostal retractions, no accessory muscle use. Heart: RRR,  no murmur.  No pretibial edema bilaterally  Abdomen:  Not distended, soft, non-tender. No rebound or rigidity.   Skin: Exposed areas without rash. Not pale. Not jaundice Neurologic:  alert & oriented X3.  Speech normal, gait appropriate for age and unassisted Strength symmetric and appropriate for age.  Psych: Cognition and judgment appear intact.  Cooperative with normal attention span and concentration.  Behavior appropriate. No anxious or depressed appearing.    Assessment & Plan:   Assessment HTN Hyperlipidemia Tinnitus (used to take xanas very rarely) E.D. Normal stress test 2008 per patient + FH CAD: Aspirin daily  Plan: HTN: BP was a slightly high 2 weeks ago, is now normal after he lost some weight and increased Toprol to the normal dose of one tablet daily. Plan: Continue losartan,  Toprol. Pulse is slt slow, he is asx High cholesterol: Continue Lipitor, check labs  Tinnitus: Stable, does not take Xanax Check testosterone? Patient would like to check, some of his friends "feel better" with it and wonders if he may benefit; has no sx of low testosterone. After a long conversation we agreed not to pursue a testosterone check  RTC one year

## 2017-03-18 LAB — COMPREHENSIVE METABOLIC PANEL
ALT: 18 U/L (ref 0–53)
AST: 23 U/L (ref 0–37)
Albumin: 4.4 g/dL (ref 3.5–5.2)
Alkaline Phosphatase: 56 U/L (ref 39–117)
BUN: 20 mg/dL (ref 6–23)
CALCIUM: 9.5 mg/dL (ref 8.4–10.5)
CHLORIDE: 102 meq/L (ref 96–112)
CO2: 28 meq/L (ref 19–32)
CREATININE: 0.95 mg/dL (ref 0.40–1.50)
GFR: 84.6 mL/min (ref >60.00)
Glucose, Bld: 87 mg/dL (ref 70–99)
Potassium: 4.5 mEq/L (ref 3.5–5.1)
SODIUM: 139 meq/L (ref 135–145)
Total Bilirubin: 0.9 mg/dL (ref 0.2–1.2)
Total Protein: 7 g/dL (ref 6.0–8.3)

## 2017-03-18 LAB — CBC WITH DIFFERENTIAL/PLATELET
BASOS ABS: 0.1 10*3/uL (ref 0.0–0.1)
Basophils Relative: 1.3 % (ref 0.0–3.0)
EOS ABS: 0.3 10*3/uL (ref 0.0–0.7)
Eosinophils Relative: 4.4 % (ref 0.0–5.0)
HEMATOCRIT: 41.4 % (ref 39.0–52.0)
Hemoglobin: 14.4 g/dL (ref 13.0–17.0)
LYMPHS ABS: 1.8 10*3/uL (ref 0.7–4.0)
LYMPHS PCT: 30.1 % (ref 12.0–46.0)
MCHC: 34.7 g/dL (ref 30.0–36.0)
MCV: 89.1 fl (ref 78.0–100.0)
MONO ABS: 0.5 10*3/uL (ref 0.1–1.0)
Monocytes Relative: 9.2 % (ref 3.0–12.0)
NEUTROS ABS: 3.3 10*3/uL (ref 1.4–7.7)
NEUTROS PCT: 55 % (ref 43.0–77.0)
Platelets: 206 10*3/uL (ref 150.0–400.0)
RBC: 4.65 Mil/uL (ref 4.22–5.81)
RDW: 12.9 % (ref 11.5–15.5)
WBC: 5.9 10*3/uL (ref 4.0–10.5)

## 2017-03-18 LAB — PSA: PSA: 0.41 ng/mL (ref 0.10–4.00)

## 2017-03-18 LAB — HEPATITIS C ANTIBODY: HCV AB: NEGATIVE

## 2017-03-18 LAB — LIPID PANEL
CHOL/HDL RATIO: 3
Cholesterol: 173 mg/dL (ref 0–200)
HDL: 52 mg/dL (ref >39.00)
LDL CALC: 105 mg/dL — AB (ref 0–99)
NonHDL: 121.13
TRIGLYCERIDES: 81 mg/dL (ref 0.0–149.0)
VLDL: 16.2 mg/dL (ref 0.0–40.0)

## 2017-03-18 NOTE — Assessment & Plan Note (Signed)
HTN: BP was a slightly high 2 weeks ago, is now normal after he lost some weight and increased Toprol to the normal dose of one tablet daily. Plan: Continue losartan, Toprol. Pulse is slt slow, he is asx High cholesterol: Continue Lipitor, check labs  Tinnitus: Stable, does not take Xanax Check testosterone? Patient would like to check, some of his friends "feel better" with it and wonders if he may benefit; has no sx of low testosterone. After a long conversation we agreed not to pursue a testosterone check  RTC one year

## 2017-03-27 ENCOUNTER — Other Ambulatory Visit: Payer: Self-pay | Admitting: Internal Medicine

## 2017-04-27 ENCOUNTER — Other Ambulatory Visit: Payer: Self-pay

## 2017-04-27 MED ORDER — METOPROLOL SUCCINATE ER 50 MG PO TB24: 50.0000 mg | ORAL_TABLET | Freq: Every day | ORAL | 3 refills | Status: DC

## 2017-05-26 ENCOUNTER — Other Ambulatory Visit: Payer: Self-pay | Admitting: Internal Medicine

## 2017-07-10 ENCOUNTER — Other Ambulatory Visit: Payer: Self-pay

## 2017-07-10 MED ORDER — FLUTICASONE PROPIONATE 50 MCG/ACT NA SUSP: 2.0000 | Freq: Every day | NASAL | 5 refills | Status: DC | PRN

## 2017-07-13 ENCOUNTER — Other Ambulatory Visit: Payer: Self-pay

## 2017-07-13 MED ORDER — FLUTICASONE PROPIONATE 50 MCG/ACT NA SUSP: 2.0000 | Freq: Every day | NASAL | 1 refills | Status: DC | PRN

## 2018-03-23 ENCOUNTER — Encounter: Payer: BLUE CROSS/BLUE SHIELD | Admitting: Internal Medicine

## 2018-04-05 ENCOUNTER — Other Ambulatory Visit: Payer: Self-pay | Admitting: Internal Medicine

## 2018-04-13 ENCOUNTER — Ambulatory Visit: Payer: Self-pay | Admitting: *Deleted

## 2018-04-13 NOTE — Telephone Encounter (Signed)
Called in c/o left groin pain that began a week ago.   The pain comes and goes.  It really bothered him while mowing the yard yesterday.   He played 18 holes of golf on Saturday and did fine.   He denies feeling a lump or swelling in left groin.   No diarrhea or constipation.      "I'm just worried I may have a hernia".     I made him an appt with Dr. Larose Kells for 04/14/18 3:20.     I instructed him to call us back or go to the ED if he began having severe pain that is constant and/or feels a knot or swelling in his groin.   He verbalized understanding.  Reason for Disposition . Age > 60 years  Answer Assessment - Initial Assessment Questions 1. LOCATION: "Where does it hurt?"      Left pubic area.   It comes and goes.   Yesterday mowing the grass it really hurt.     It's going on for a week.    It hurts when I cough. 2. RADIATION: "Does the pain shoot anywhere else?" (e.g., chest, back)     It will hurt 15 minutes and then go away.   Not spreading. 3. ONSET: "When did the pain begin?" (Minutes, hours or days ago)      A week ago. 4. SUDDEN: "Gradual or sudden onset?"     I could feel it a little bit but now it's hurting more.    5. PATTERN "Does the pain come and go, or is it constant?"    - If constant: "Is it getting better, staying the same, or worsening?"      (Note: Constant means the pain never goes away completely; most serious pain is constant and it progresses)     - If intermittent: "How long does it last?" "Do you have pain now?"     (Note: Intermittent means the pain goes away completely between bouts)     It comes and goes.    6. SEVERITY: "How bad is the pain?"  (e.g., Scale 1-10; mild, moderate, or severe)    - MILD (1-3): doesn't interfere with normal activities, abdomen soft and not tender to touch     - MODERATE (4-7): interferes with normal activities or awakens from sleep, tender to touch     - SEVERE (8-10): excruciating pain, doubled over, unable to do any normal  activities       If I cough I can feel it.   7. RECURRENT SYMPTOM: "Have you ever had this type of abdominal pain before?" If so, ask: "When was the last time?" and "What happened that time?"      No.    I lift  Weights.    I played  18 holes of golf Saturday. 8. CAUSE: "What do you think is causing the abdominal pain?"     Maybe a hernia. 9. RELIEVING/AGGRAVATING FACTORS: "What makes it better or worse?" (e.g., movement, antacids, bowel movement)     No.   10. OTHER SYMPTOMS: "Has there been any vomiting, diarrhea, constipation, or urine problems?"       No.  Protocols used: ABDOMINAL PAIN - MALE-A-AH

## 2018-04-13 NOTE — Telephone Encounter (Signed)
noted 

## 2018-04-13 NOTE — Telephone Encounter (Signed)
FYI to PCP

## 2018-04-14 ENCOUNTER — Encounter: Payer: Self-pay | Admitting: Internal Medicine

## 2018-04-14 ENCOUNTER — Ambulatory Visit: Payer: BLUE CROSS/BLUE SHIELD | Admitting: Internal Medicine

## 2018-04-14 VITALS — BP 132/78 | HR 58 | Temp 97.8°F | Resp 14 | Ht 69.0 in | Wt 192.1 lb

## 2018-04-14 DIAGNOSIS — R1032 Left lower quadrant pain: Secondary | ICD-10-CM

## 2018-04-14 DIAGNOSIS — R600 Localized edema: Secondary | ICD-10-CM

## 2018-04-14 MED ORDER — METOPROLOL SUCCINATE ER 50 MG PO TB24: 50.0000 mg | ORAL_TABLET | Freq: Every day | ORAL | 1 refills | Status: DC

## 2018-04-14 MED ORDER — FLUTICASONE PROPIONATE 50 MCG/ACT NA SUSP: 2.0000 | Freq: Every day | NASAL | 3 refills | Status: DC | PRN

## 2018-04-14 MED ORDER — ATORVASTATIN CALCIUM 40 MG PO TABS: 20.0000 mg | ORAL_TABLET | Freq: Every day | ORAL | 1 refills | Status: DC

## 2018-04-14 NOTE — Progress Notes (Signed)
Subjective:    Patient ID: Ralph [name], male    DOB: 05-24-1951, 67 y.o.   MRN: [id number]  DOS:  04/14/2018 Type of visit - description : acute Interval history: Symptoms started 2 weeks ago, having a discomfort at the left groin, on and off, no radiation, not particularly worse when he walks or plays golf. He did notice the pain to increase sometimes when doing crunches . Has not seen any bulging area there.  No rash Also, some lower extremity edema at the end of the day   Review of Systems   No fever, chills.  No dysuria or gross hematuria No constipation No testicular pain or swelling Past Medical History:  Diagnosis Date  . Hyperlipidemia 2007  . Hypertension 2007  . Normal cardiac stress test 2008   per pt    Past Surgical History:  Procedure Laterality Date  . NO PAST SURGERIES      Social History   Socioeconomic History  . Marital status: Divorced    Spouse name: Not on file  . Number of children: 1  . Years of education: Not on file  . Highest education level: Not on file  Occupational History  . Occupation: Press photographer, hearing aid  Social Needs  . Financial resource strain: Not on file  . Food insecurity:    Worry: Not on file    Inability: Not on file  . Transportation needs:    Medical: Not on file    Non-medical: Not on file  Tobacco Use  . Smoking status: Never Smoker  . Smokeless tobacco: Never Used  Substance and Sexual Activity  . Alcohol use: Yes    Comment: socially  . Drug use: No  . Sexual activity: Not on file  Lifestyle  . Physical activity:    Days per week: Not on file    Minutes per session: Not on file  . Stress: Not on file  Relationships  . Social connections:    Talks on phone: Not on file    Gets together: Not on file    Attends religious service: Not on file    Active member of club or organization: Not on file    Attends meetings of clubs or organizations: Not on file    Relationship status: Not on file  . Intimate  partner violence:    Fear of current or ex partner: Not on file    Emotionally abused: Not on file    Physically abused: Not on file    Forced sexual activity: Not on file  Other Topics Concern  . Not on file  Social History Narrative   Original from [place] w/  girlfriend part time                Allergies as of 04/14/2018      Reactions   Penicillins       Medication List        Accurate as of 04/14/18  4:09 PM. Always use your most recent med list.          aspirin 81 MG tablet Take 81 mg by mouth daily.   atorvastatin 40 MG tablet Commonly known as:  LIPITOR Take 0.5 tablets (20 mg total) by mouth daily.   fluticasone 50 MCG/ACT nasal spray Commonly known as:  FLONASE Place 2 sprays into both nostrils daily as needed for allergies or rhinitis.   losartan 100 MG tablet Commonly known as:  COZAAR Take 1 tablet (  100 mg total) by mouth daily.   metoprolol succinate 50 MG 24 hr tablet Commonly known as:  TOPROL-XL Take 1 tablet (50 mg total) by mouth daily.          Objective:   Physical Exam BP 132/78 (BP Location: Left Arm, Patient Position: Sitting, Cuff Size: Small)   Pulse (!) 58   Temp 97.8 F (36.6 C) (Oral)   Resp 14   Ht 5\' 9"  (1.753 m)   Wt 192 lb 2 oz (87.1 kg)   SpO2 97%   BMI 28.37 kg/m  General:   Well developed, well nourished . NAD.  HEENT:  Normocephalic . Face symmetric, atraumatic Lower extremity: Trace pretibial and peri-ankle edema, normal pedal pulses, multiple superficial varicose veins noted without phlebitis  Abdomen:  Not distended, soft, non-tender. No rebound or rigidity.  No hernias. Groins: No LADs, no hernias with Valsalva maneuver GU: Left testicle slightly larger than the right, nontender, epididymis on the left is also more noticeable.  This is a chronic finding according to the patient.  Penis with no discharge or lesions. Skin: Not pale. Not jaundice Neurologic:  alert & oriented X3.  Speech normal, gait  appropriate for age and unassisted Psych--  Cognition and judgment appear intact.  Cooperative with normal attention span and concentration.  Behavior appropriate. No anxious or depressed appearing.     Assessment & Plan:     Assessment HTN Hyperlipidemia Tinnitus (used to take xanas very rarely) E.D. Normal stress test 2008 per patient + FH CAD: Aspirin daily  Plan: Pain, left groin: No evidence of hernia on exam, recommend observation, he does a lot of crunches at the gym, recommend to slow down for a couple of weeks.  Call if persistent symptoms, swelling or a bulging area on the groin. Edema: Patient takes medication for BP but not amlodipine.  Likely dependent and possibly related to varicose veins.  Recommend compression stockings. Varicose veins: Previously evaluated elsewhere, is contemplating a procedure, recommend to call for interventional radiology referral if/when ready. Physical exam a few weeks already scheduled

## 2018-04-14 NOTE — Progress Notes (Signed)
Pre visit review using our clinic review tool, if applicable. No additional management support is needed unless otherwise documented below in the visit note. 

## 2018-04-16 NOTE — Assessment & Plan Note (Signed)
Pain, left groin: No evidence of hernia on exam, recommend observation, he does a lot of crunches at the gym, recommend to slow down for a couple of weeks.  Call if persistent symptoms, swelling or a bulging area on the groin. Edema: Patient takes medication for BP but not amlodipine.  Likely dependent and possibly related to varicose veins.  Recommend compression stockings. Varicose veins: Previously evaluated elsewhere, is contemplating a procedure, recommend to call for interventional radiology referral if/when ready. Physical exam a few weeks already scheduled

## 2018-04-27 ENCOUNTER — Ambulatory Visit: Payer: Self-pay | Admitting: *Deleted

## 2018-04-27 NOTE — Telephone Encounter (Signed)
Duplicate Encounter

## 2018-04-27 NOTE — Telephone Encounter (Signed)
Patient advised no heavy lifting patient advised if symptoms worse- seek attention immediately . Appointment made with Dr Nani Ravens for 3 pm   Reason for Disposition . [1] MODERATE pain (e.g., interferes with normal activities) AND [2] pain comes and goes (cramps) AND [3] present > 24 hours  (Exception: pain with Vomiting or Diarrhea - see that Guideline)  Answer Assessment - Initial Assessment Questions 1. LOCATION: "Where does it hurt?"       l lower inguinal and midline below belly button   2. RADIATION: "Does the pain shoot anywhere else?" (e.g., chest, back)       NONE 3. ONSET: "When did the pain begin?" (Minutes, hours or days ago)        Pain l  Inguinal  intermittant x  3  Weeks now  Is  3  Also has  Discomfort  Below  [place]  X  1  Week  -  Pain mild  Uncomfortable  Feeling similar to needing to have a  bm   4. SUDDEN: "Gradual or sudden onset?"     Gradual  Symptoms   5. PATTERN "Does the pain come and go, or is it constant?"    - If constant: "Is it getting better, staying the same, or worsening?"      (Note: Constant means the pain never goes away completely; most serious pain is constant and it progresses)     - If intermittent: "How long does it last?" "Do you have pain now?"     (Note: Intermittent means the pain goes away completely between bouts)     intermittant   6. SEVERITY: "How bad is the pain?"  (e.g., Scale 1-10; mild, moderate, or severe)    - MILD (1-3): doesn't interfere with normal activities, abdomen soft and not tender to touch     - MODERATE (4-7): interferes with normal activities or awakens from sleep, tender to touch     - SEVERE (8-10): excruciating pain, doubled over, unable to do any normal activities         Mild  At this  Time    7. RECURRENT SYMPTOM: "Have you ever had this type of abdominal pain before?" If so, ask: "When was the last time?" and "What happened that time?"        This  Episode is the first time it has occurred  8. CAUSE: "What do you  think is causing the abdominal pain?"       Unclear   9. RELIEVING/AGGRAVATING FACTORS: "What makes it better or worse?" (e.g., movement, antacids, bowel movement)     2  Days ago  Took laxative had results   10. OTHER SYMPTOMS: "Has there been any vomiting, diarrhea, constipation, or urine problems?"       Constipation -  Sensation of   Bloating  -  Protocols used: ABDOMINAL PAIN - MALE-A-AH

## 2018-04-28 ENCOUNTER — Ambulatory Visit (INDEPENDENT_AMBULATORY_CARE_PROVIDER_SITE_OTHER): Payer: BLUE CROSS/BLUE SHIELD | Admitting: Family Medicine

## 2018-04-28 ENCOUNTER — Encounter: Payer: Self-pay | Admitting: Family Medicine

## 2018-04-28 VITALS — BP 132/82 | HR 55 | Temp 97.5°F | Ht 69.0 in | Wt 190.0 lb

## 2018-04-28 DIAGNOSIS — R1032 Left lower quadrant pain: Secondary | ICD-10-CM

## 2018-04-28 NOTE — Progress Notes (Signed)
Chief Complaint  Patient presents with  . Abdominal Pain    Ralph [name] is here for LLQ abdominal pain.  Duration: 4 weeks Nighttime awakenings? No Bleeding? No Weight loss? No Palliation: None Provocation: None Associated symptoms: Probably in stomach, constipation, straining Denies: fever, nausea and vomiting Treatment to date: Dulcolax   ROS: Constitutional: No fevers GI: No N/V/D/C, no bleeding + pain  Past Medical History:  Diagnosis Date  . Hyperlipidemia 2007  . Hypertension 2007  . Normal cardiac stress test 2008   per pt   Family History  Problem Relation Age of Onset  . Breast cancer Mother   . Bone cancer Mother   . Coronary artery disease Father        w/ first MI age 78, GF MI in the early 55s  . Hypertension Sister   . Prostate cancer Neg Hx   . Colon cancer Neg Hx   . Diabetes Neg Hx    Past Surgical History:  Procedure Laterality Date  . NO PAST SURGERIES      BP 132/82 (BP Location: Right Arm, Patient Position: Sitting, Cuff Size: Normal)   Pulse (!) 55   Temp (!) 97.5 F (36.4 C) (Oral)   Ht 5\' 9"  (1.753 m)   Wt 190 lb (86.2 kg)   SpO2 99%   BMI 28.06 kg/m  Gen.: Awake, alert, appears stated age 67: Mucous membranes moist without mucosal lesions Heart: Regular rate and rhythm without murmurs Lungs: Clear auscultation bilaterally, no rales or wheezing, normal effort without accessory muscle use. Abdomen: Bowel sounds are present. Abdomen is soft, nontender, nondistended, no masses or organomegaly. Negative Murphy's, Rovsing's, McBurney's, and Carnett's sign. MSK: No tenderness to palpation over the hip flexor, negative logroll, Stinchfield, FABER, FADDIR, no tenderness over the greater trochanter GU: testes not ttp, no bulges, no hernia appreciated Psych: Age appropriate judgment and insight. Normal mood and affect.  Abdominal pain, LLQ (left lower quadrant)  Based on his story, I am more concerned for constipation.  I will have him  take MiraLAX 1-2 times daily for the next 3 to 4 days.  If no improvement, I would like him to use an enema. I would have a low threshold to get an x-ray of the hip.  We could consider an ultrasound of the inguinal region as well.  I do agree with Dr. Mamie Nick without any appreciable bulge or signs of hernia on exam. F/u in 4-6 weeks. Pt voiced understanding and agreement to the plan.  [place], DO 04/28/18 5:06 PM

## 2018-04-28 NOTE — Patient Instructions (Addendum)
Try MiraLAX 1-2 times daily over the next 3-4 days. If no improvement, try using an enema. Stay well hydrated and keep lots of fiber in your diet.  If we aren't any better, I would have a low threshold to image your hip. Let me know.   Stay active.  Stay well hydrated. Keep fiber in your diet.  Let us know if you need anything.

## 2018-04-28 NOTE — Progress Notes (Signed)
Pre visit review using our clinic review tool, if applicable. No additional management support is needed unless otherwise documented below in the visit note. 

## 2018-05-06 ENCOUNTER — Encounter: Payer: Self-pay | Admitting: Internal Medicine

## 2018-05-06 ENCOUNTER — Ambulatory Visit: Payer: BLUE CROSS/BLUE SHIELD | Admitting: Internal Medicine

## 2018-05-06 VITALS — BP 164/64 | HR 54 | Temp 98.3°F | Resp 16 | Ht 67.5 in | Wt 185.0 lb

## 2018-05-06 DIAGNOSIS — R1032 Left lower quadrant pain: Secondary | ICD-10-CM | POA: Diagnosis not present

## 2018-05-06 LAB — COMPREHENSIVE METABOLIC PANEL
ALT: 18 U/L (ref 0–53)
AST: 18 U/L (ref 0–37)
Albumin: 4.5 g/dL (ref 3.5–5.2)
Alkaline Phosphatase: 55 U/L (ref 39–117)
BUN: 15 mg/dL (ref 6–23)
CALCIUM: 10 mg/dL (ref 8.4–10.5)
CHLORIDE: 103 meq/L (ref 96–112)
CO2: 31 meq/L (ref 19–32)
CREATININE: 0.91 mg/dL (ref 0.40–1.50)
GFR: 88.59 mL/min (ref >60.00)
Glucose, Bld: 134 mg/dL — ABNORMAL HIGH (ref 70–99)
Potassium: 5 mEq/L (ref 3.5–5.1)
SODIUM: 141 meq/L (ref 135–145)
Total Bilirubin: 0.9 mg/dL (ref 0.2–1.2)
Total Protein: 7.4 g/dL (ref 6.0–8.3)

## 2018-05-06 LAB — HEMOCCULT GUIAC POC 1CARD (OFFICE): Fecal Occult Blood, POC: NEGATIVE

## 2018-05-06 LAB — POC URINALSYSI DIPSTICK (AUTOMATED)
Bilirubin, UA: NEGATIVE
Blood, UA: NEGATIVE
GLUCOSE UA: NEGATIVE
Ketones, UA: NEGATIVE
LEUKOCYTES UA: NEGATIVE
Nitrite, UA: NEGATIVE
Protein, UA: NEGATIVE
SPEC GRAV UA: 1.01 (ref 1.010–1.025)
UROBILINOGEN UA: 0.2 U/dL
pH, UA: 6 (ref 5.0–8.0)

## 2018-05-06 LAB — CBC
HEMATOCRIT: 43.8 % (ref 39.0–52.0)
HEMOGLOBIN: 14.9 g/dL (ref 13.0–17.0)
MCHC: 34 g/dL (ref 30.0–36.0)
MCV: 91 fl (ref 78.0–100.0)
Platelets: 225 10*3/uL (ref 150.0–400.0)
RBC: 4.82 Mil/uL (ref 4.22–5.81)
RDW: 12.8 % (ref 11.5–15.5)
WBC: 6.2 10*3/uL (ref 4.0–10.5)

## 2018-05-06 NOTE — Patient Instructions (Signed)
GO TO THE LAB : Get the blood work     GO TO THE FRONT DESK Schedule your next appointment for a   physical exam in 2 months      STOP BY THE FIRST FLOOR:   Please tell them I am requesting CT of the abdomen and pelvis.

## 2018-05-06 NOTE — Assessment & Plan Note (Signed)
LLQ pain, change in bowel habits for the last 3 weeks. See previous visit, he complained to me of pain at the left groin but now the pain is extended to the LLQ of the abdomen. He has lost few pounds in the last week, Hemoccult is negative, rectal exam is essentially normal. Udip (-) Needs further investigation, DDX is extensive, mild diverticulitis?  Others?. Check a CMP, CBC, UCX CT abdomen with and without, refer to GI, he is due for a colonoscopy. RTC 2 months CPX

## 2018-05-06 NOTE — Progress Notes (Signed)
Subjective:    Patient ID: Ralph [name], male    DOB: [birth year], 67 y.o.   MRN: [id number]  DOS:  05/06/2018 Type of visit - description : acute Interval history: I saw the patient 04/14/2018, at the time he was having left groin pain. Shortly after he started to feel that the pain was also on the left lower quadrant of the abdomen. Since then he has experienced change in bowel habits.  He used to have 2 BMs daily, lately he is not having a bowel movement every day. Saw Dr. Nani Ravens,  04/28/2018, he felt that the patient was constipated, Rx  MiraLAX, Dulcolax; meds did cause an increase in his BMs and lower abdominal cramping but otherwise he continue with the discomfort at the LLQ. That area seems to be slightly TTP on self palpation.  Wt Readings from Last 3 Encounters:  05/06/18 185 lb (83.9 kg)  04/28/18 190 lb (86.2 kg)  04/14/18 192 lb 2 oz (87.1 kg)    Review of Systems No fever chills Has lost 5 pounds in the last week. Stools are normal in color, they seem to be slightly hard and also thin (?). No nausea, vomiting.  No actual blood in the stools. No dysuria or gross hematuria.   Past Medical History:  Diagnosis Date  . Hyperlipidemia 2007  . Hypertension 2007  . Normal cardiac stress test 2008   per pt    Past Surgical History:  Procedure Laterality Date  . NO PAST SURGERIES      Social History   Socioeconomic History  . Marital status: Divorced    Spouse name: Not on file  . Number of children: 1  . Years of education: Not on file  . Highest education level: Not on file  Occupational History  . Occupation: Press photographer, hearing aid  Social Needs  . Financial resource strain: Not on file  . Food insecurity:    Worry: Not on file    Inability: Not on file  . Transportation needs:    Medical: Not on file    Non-medical: Not on file  Tobacco Use  . Smoking status: Never Smoker  . Smokeless tobacco: Never Used  Substance and Sexual Activity  . Alcohol use:  Yes    Comment: socially  . Drug use: No  . Sexual activity: Not on file  Lifestyle  . Physical activity:    Days per week: Not on file    Minutes per session: Not on file  . Stress: Not on file  Relationships  . Social connections:    Talks on phone: Not on file    Gets together: Not on file    Attends religious service: Not on file    Active member of club or organization: Not on file    Attends meetings of clubs or organizations: Not on file    Relationship status: Not on file  . Intimate partner violence:    Fear of current or ex partner: Not on file    Emotionally abused: Not on file    Physically abused: Not on file    Forced sexual activity: Not on file  Other Topics Concern  . Not on file  Social History Narrative   Original from [place] w/  girlfriend part time                Allergies as of 05/06/2018      Reactions   Penicillins  Medication List        Accurate as of 05/06/18  9:55 AM. Always use your most recent med list.          aspirin 81 MG tablet Take 81 mg by mouth daily.   atorvastatin 40 MG tablet Commonly known as:  LIPITOR Take 0.5 tablets (20 mg total) by mouth daily.   fluticasone 50 MCG/ACT nasal spray Commonly known as:  FLONASE Place 2 sprays into both nostrils daily as needed for allergies or rhinitis.   losartan 100 MG tablet Commonly known as:  COZAAR Take 1 tablet (100 mg total) by mouth daily.   metoprolol succinate 50 MG 24 hr tablet Commonly known as:  TOPROL-XL Take 1 tablet (50 mg total) by mouth daily.          Objective:   Physical Exam  Abdominal:     BP (!) 164/64 (BP Location: Left Arm, Patient Position: Sitting, Cuff Size: Small)   Pulse (!) 54   Temp 98.3 F (36.8 C) (Oral)   Resp 16   Ht 5' 7.5" (1.715 m)   Wt 185 lb (83.9 kg)   SpO2 100%   BMI 28.55 kg/m  General:   Well developed, well nourished . NAD.  HEENT:  Normocephalic . Face symmetric, atraumatic.  Not pale Lungs:    CTA B Normal respiratory effort, no intercostal retractions, no accessory muscle use. Heart: RRR,  no murmur.  no pretibial edema bilaterally  Abdomen:  Not distended, soft, minimal if any tenderness at the LLQ, see graphic.  No mass or rebound. Rectal: External abnormalities: none. Normal sphincter tone. No rectal masses or tenderness.  Brown stools, Hemoccult negative Prostate: Prostate gland firm and smooth, no enlargement, nodularity, tenderness, mass, asymmetry or induration Skin: Not pale. Not jaundice Neurologic:  alert & oriented X3.  Speech normal, gait appropriate for age and unassisted Psych--  Cognition and judgment appear intact.  Cooperative with normal attention span and concentration.  Behavior appropriate. No anxious or depressed appearing.     Assessment & Plan:    Assessment HTN Hyperlipidemia Tinnitus (used to take xanas very rarely) E.D. Normal stress test 2008 per patient + FH CAD: Aspirin daily  Plan: LLQ pain, change in bowel habits for the last 3 weeks. See previous visit, he complained to me of pain at the left groin but now the pain is extended to the LLQ of the abdomen. He has lost few pounds in the last week, Hemoccult is negative, rectal exam is essentially normal. Udip (-) Needs further investigation, DDX is extensive, mild diverticulitis?  Others?. Check a CMP, CBC, UCX CT abdomen with and without, refer to GI, he is due for a colonoscopy. RTC 2 months CPX

## 2018-05-07 ENCOUNTER — Telehealth: Payer: Self-pay | Admitting: Internal Medicine

## 2018-05-07 LAB — URINE CULTURE
MICRO NUMBER:: 90627704
RESULT: NO GROWTH
SPECIMEN QUALITY:: ADEQUATE

## 2018-05-07 NOTE — Telephone Encounter (Signed)
Copied from [place] [id number]. Topic: Quick Communication - See Telephone Encounter >> May 07, 2018 11:25 AM Bea Graff, NT wrote: CRM for notification. See Telephone encounter for: 05/07/18. Pt would like a call to go over his lab results.

## 2018-05-07 NOTE — Telephone Encounter (Signed)
Labs have not been reviewed by MD yet- Pt was seen yesterday afternoon. Once labs have been reviewed, Pt will receive a call.

## 2018-05-08 ENCOUNTER — Other Ambulatory Visit (HOSPITAL_BASED_OUTPATIENT_CLINIC_OR_DEPARTMENT_OTHER): Payer: BLUE CROSS/BLUE SHIELD

## 2018-05-08 ENCOUNTER — Encounter: Payer: Self-pay | Admitting: Internal Medicine

## 2018-05-08 ENCOUNTER — Ambulatory Visit (HOSPITAL_BASED_OUTPATIENT_CLINIC_OR_DEPARTMENT_OTHER)
Admission: RE | Admit: 2018-05-08 | Discharge: 2018-05-08 | Disposition: A | Discharge status: Home / Self Care | Payer: BLUE CROSS/BLUE SHIELD | Source: Ambulatory Visit | Attending: Internal Medicine | Admitting: Internal Medicine

## 2018-05-08 DIAGNOSIS — N2889 Other specified disorders of kidney and ureter: Secondary | ICD-10-CM | POA: Insufficient documentation

## 2018-05-08 DIAGNOSIS — I700 Atherosclerosis of aorta: Secondary | ICD-10-CM | POA: Insufficient documentation

## 2018-05-08 DIAGNOSIS — R1032 Left lower quadrant pain: Secondary | ICD-10-CM | POA: Diagnosis present

## 2018-05-08 MED ORDER — IOPAMIDOL (ISOVUE-300) INJECTION 61%
100.0000 mL | Freq: Once | INTRAVENOUS | Status: AC | PRN
  Administered 2018-05-08: 100 mL via INTRAVENOUS

## 2018-05-11 ENCOUNTER — Telehealth: Payer: Self-pay | Admitting: Internal Medicine

## 2018-05-11 NOTE — Telephone Encounter (Signed)
Copied from [place] [id number]. Topic: Quick Communication - See Telephone Encounter >> May 11, 2018 10:46 AM Rutherford Nail, NT wrote: CRM for notification. See Telephone encounter for: 05/11/18. Patient calling and states that he has an appointment with Dr Lorin Mercy PA with Gastroenterology in the morning. Would like his records sent (CT, Blood work, and Urine) to them so that way he does not have to go through all the tests again. Please advise. CB#: [phone]

## 2018-05-11 NOTE — Telephone Encounter (Signed)
Records faxed to [facility].

## 2018-05-21 LAB — HM COLONOSCOPY

## 2018-05-25 ENCOUNTER — Telehealth: Payer: Self-pay | Admitting: Internal Medicine

## 2018-05-25 DIAGNOSIS — R1032 Left lower quadrant pain: Secondary | ICD-10-CM

## 2018-05-25 NOTE — Telephone Encounter (Signed)
Copied from [place] [id number]. Topic: Quick Communication - See Telephone Encounter >> May 25, 2018  9:27 AM Boyd Kerbs wrote: CRM for notification. See Telephone encounter for: 05/25/18.  Pt. Asking for referral for Dr. Ninfa Linden at [facility] with following information sent:   Pt. Had CT scan, colonoscopy, blood test, notes for visit, needs the information sent to Dr. Ninfa Linden at [facility].  He is going to check his left lower front  Has appt on Friday 6/14

## 2018-05-25 NOTE — Telephone Encounter (Signed)
Referral placed.

## 2018-05-25 NOTE — Telephone Encounter (Signed)
Please advise 

## 2018-05-25 NOTE — Telephone Encounter (Signed)
Okay to arrange referral, to general surgery Dr. Ninfa Linden.  DX LLQ abdominal pain.  Send all the records. If he was seen by GI, recommend to get records to me

## 2018-05-28 ENCOUNTER — Other Ambulatory Visit: Payer: Self-pay

## 2018-05-28 ENCOUNTER — Encounter (HOSPITAL_BASED_OUTPATIENT_CLINIC_OR_DEPARTMENT_OTHER): Payer: Self-pay | Admitting: *Deleted

## 2018-05-28 ENCOUNTER — Other Ambulatory Visit: Payer: Self-pay | Admitting: Surgery

## 2018-05-28 NOTE — Pre-Procedure Instructions (Signed)
Pt is add-on for surgery Monday, he was given ensure presurgery with instruction to drink by 1130. He was also given CHG surgical scrub with instructions, pt voiced understanding with questions. He will need EKG dos.

## 2018-05-30 NOTE — H&P (Signed)
Ralph [name] Documented: 05/28/2018 11:20 AM Location: [facility] Patient #: [id number] DOB: 31-Mar-1951 Divorced / Language: [name] / Race: White Male   History of Present Illness (Takari Duncombe A. Ninfa Linden MD; 05/28/2018 11:37 AM) The patient is a 67 year old male who presents for an evaluation of a hernia. This gentleman is referred by Dr. Larose Kells for left lower abdominal pain. He has been having this for several months now. His complete workup including a CAT scan which was unremarkable and recently had a colonoscopy last week was normal as well except for a small polyp. He is now noticed a bulge in the left inguinal area which reduces at night. He is very active and does a lot of lifting and vigorous activities. The pain is also worse when he has bowel movements. The pain is moderate in intensity and described as an ache rather than sharp pain   Past Surgical History Malachi Bonds, CMA; 05/28/2018 11:20 AM) No pertinent past surgical history   Diagnostic Studies History Malachi Bonds, CMA; 05/28/2018 11:20 AM) Colonoscopy  within last year  Allergies Malachi Bonds, CMA; 05/28/2018 11:21 AM) No Known Drug Allergies [05/28/2018]: Penicillins   Medication History (Chemira Jones, CMA; 05/28/2018 11:21 AM) Atorvastatin Calcium (40MG  Tablet, Oral) Active. Losartan Potassium (100MG  Tablet, Oral) Active. Metoprolol Succinate ER (50MG  Tablet ER 24HR, Oral) Active. Medications Reconciled  Social History Malachi Bonds, CMA; 05/28/2018 11:20 AM) Tobacco use  Never smoker.  Family History Malachi Bonds, CMA; 05/28/2018 11:20 AM) Heart disease in male family member before age 49   Other Problems Malachi Bonds, CMA; 05/28/2018 11:20 AM) Other disease, cancer, significant illness     Review of Systems Malachi Bonds CMA; 05/28/2018 11:20 AM) General Not Present- Appetite Loss, Chills, Fatigue, Fever, Night Sweats, Weight Gain and Weight Loss. Skin Not Present- Change in  Wart/Mole, Dryness, Hives, Jaundice, New Lesions, Non-Healing Wounds, Rash and Ulcer. HEENT Not Present- Earache, Hearing Loss, Hoarseness, Nose Bleed, Oral Ulcers, Ringing in the Ears, Seasonal Allergies, Sinus Pain, Sore Throat, Visual Disturbances, Wears glasses/contact lenses and Yellow Eyes. Breast Not Present- Breast Mass, Breast Pain, Nipple Discharge and Skin Changes. Cardiovascular Not Present- Chest Pain, Difficulty Breathing Lying Down, Leg Cramps, Palpitations, Rapid Heart Rate, Shortness of Breath and Swelling of Extremities. Gastrointestinal Not Present- Abdominal Pain, Bloating, Bloody Stool, Change in Bowel Habits, Chronic diarrhea, Constipation, Difficulty Swallowing, Excessive gas, Gets full quickly at meals, Hemorrhoids, Indigestion, Nausea, Rectal Pain and Vomiting. Male Genitourinary Not Present- Blood in Urine, Change in Urinary Stream, Frequency, Impotence, Nocturia, Painful Urination, Urgency and Urine Leakage. Musculoskeletal Not Present- Back Pain, Joint Pain, Joint Stiffness, Muscle Pain, Muscle Weakness and Swelling of Extremities. Neurological Not Present- Decreased Memory, Fainting, Headaches, Numbness, Seizures, Tingling, Tremor, Trouble walking and Weakness. Psychiatric Not Present- Anxiety, Bipolar, Change in Sleep Pattern, Depression, Fearful and Frequent crying. Endocrine Not Present- Cold Intolerance, Excessive Hunger, Hair Changes, Heat Intolerance, Hot flashes and New Diabetes. Hematology Not Present- Blood Thinners, Easy Bruising, Excessive bleeding, Gland problems, HIV and Persistent Infections.  Vitals (Chemira Jones CMA; 05/28/2018 11:20 AM) 05/28/2018 11:20 AM Weight: 181.2 lb Height: 69in Body Surface Area: 1.98 m Body Mass Index: 26.76 kg/m  Temp.: 97.100F(Oral)  Pulse: 65 (Regular)  BP: 150/84 (Sitting, Left Arm, Standard)       Physical Exam (Myrna Vonseggern A. Ninfa Linden MD; 05/28/2018 11:38 AM) The physical exam findings are as  follows: Note:Generally he is well appearance Lungs clear bilaterally Cardiovascular regular rate and rhythm Abdomen is soft and nontender. There is an easily  reducible, moderate sized left inguinal hernia. The right inguinal area feels weak. I cannot feel an umbilical hernia  I reviewed his CT scan    Assessment & Plan (Dachelle Molzahn A. Ninfa Linden MD; 05/28/2018 11:39 AM) BILATERAL INGUINAL HERNIA (K40.20) Impression: I discussed the diagnosis with the patient in detail. We discussed hernia repair with mesh. He is symptomatic so we recommend repair. I discussed bilateral laparoscopic inguinal hernia repair with mesh. I discussed the procedure with him in detail. I discussed the risk which includes but is not limited to bleeding, infection, injury to surrounding structures, the need to convert to an open procedure, nerve entrapment, chronic pain, cardiopulmonary issues, postoperative recovery, etc. He understands and wishes to proceed with surgery as soon as possible

## 2018-05-31 ENCOUNTER — Ambulatory Visit (HOSPITAL_BASED_OUTPATIENT_CLINIC_OR_DEPARTMENT_OTHER): Payer: BLUE CROSS/BLUE SHIELD | Admitting: Certified Registered Nurse Anesthetist

## 2018-05-31 ENCOUNTER — Encounter (HOSPITAL_BASED_OUTPATIENT_CLINIC_OR_DEPARTMENT_OTHER)
Admission: RE | Disposition: A | Discharge status: Home / Self Care | Payer: Self-pay | Source: Ambulatory Visit | Attending: Surgery

## 2018-05-31 ENCOUNTER — Ambulatory Visit (HOSPITAL_BASED_OUTPATIENT_CLINIC_OR_DEPARTMENT_OTHER)
Admission: RE | Admit: 2018-05-31 | Discharge: 2018-05-31 | Disposition: A | Discharge status: Home / Self Care | Payer: BLUE CROSS/BLUE SHIELD | Source: Ambulatory Visit | Attending: Surgery | Admitting: Surgery

## 2018-05-31 ENCOUNTER — Encounter (HOSPITAL_BASED_OUTPATIENT_CLINIC_OR_DEPARTMENT_OTHER): Payer: Self-pay | Admitting: Certified Registered Nurse Anesthetist

## 2018-05-31 ENCOUNTER — Other Ambulatory Visit: Payer: Self-pay

## 2018-05-31 DIAGNOSIS — I10 Essential (primary) hypertension: Secondary | ICD-10-CM | POA: Diagnosis not present

## 2018-05-31 DIAGNOSIS — D176 Benign lipomatous neoplasm of spermatic cord: Secondary | ICD-10-CM | POA: Insufficient documentation

## 2018-05-31 DIAGNOSIS — K4020 Bilateral inguinal hernia, without obstruction or gangrene, not specified as recurrent: Secondary | ICD-10-CM | POA: Diagnosis present

## 2018-05-31 HISTORY — PX: INSERTION OF MESH: SHX5868

## 2018-05-31 HISTORY — PX: INGUINAL HERNIA REPAIR: SHX194

## 2018-05-31 HISTORY — DX: Bilateral inguinal hernia, without obstruction or gangrene, not specified as recurrent: K40.20

## 2018-05-31 SURGERY — REPAIR, HERNIA, INGUINAL, BILATERAL, LAPAROSCOPIC
Anesthesia: General | Site: Abdomen | Laterality: Bilateral

## 2018-05-31 MED ORDER — BUPIVACAINE HCL (PF) 0.5 % IJ SOLN
INTRAMUSCULAR | Status: DC | PRN
  Administered 2018-05-31: 20 mL

## 2018-05-31 MED ORDER — ACETAMINOPHEN 500 MG PO TABS: 1000.0000 mg | ORAL_TABLET | ORAL | Status: DC

## 2018-05-31 MED ORDER — MIDAZOLAM HCL 2 MG/2ML IJ SOLN
INTRAMUSCULAR | Status: DC | PRN
  Administered 2018-05-31: 2 mg via INTRAVENOUS

## 2018-05-31 MED ORDER — PROPOFOL 10 MG/ML IV BOLUS
INTRAVENOUS | Status: DC | PRN
  Administered 2018-05-31: 170 mg via INTRAVENOUS

## 2018-05-31 MED ORDER — EPHEDRINE SULFATE-NACL 50-0.9 MG/10ML-% IV SOSY
PREFILLED_SYRINGE | INTRAVENOUS | Status: DC | PRN
  Administered 2018-05-31: 5 mg via INTRAVENOUS
  Administered 2018-05-31: 10 mg via INTRAVENOUS

## 2018-05-31 MED ORDER — GABAPENTIN 300 MG PO CAPS
ORAL_CAPSULE | ORAL | Status: AC
  Filled 2018-05-31: qty 1

## 2018-05-31 MED ORDER — SUGAMMADEX SODIUM 200 MG/2ML IV SOLN
INTRAVENOUS | Status: DC | PRN
  Administered 2018-05-31: 300 mg via INTRAVENOUS

## 2018-05-31 MED ORDER — CELECOXIB 200 MG PO CAPS: 200.0000 mg | ORAL_CAPSULE | ORAL | Status: DC

## 2018-05-31 MED ORDER — OXYCODONE HCL 5 MG PO TABS
5.0000 mg | ORAL_TABLET | Freq: Once | ORAL | Status: AC | PRN
  Administered 2018-05-31: 5 mg via ORAL

## 2018-05-31 MED ORDER — ACETAMINOPHEN 500 MG PO TABS
ORAL_TABLET | ORAL | Status: AC
  Filled 2018-05-31: qty 2

## 2018-05-31 MED ORDER — LIDOCAINE HCL (CARDIAC) PF 100 MG/5ML IV SOSY
PREFILLED_SYRINGE | INTRAVENOUS | Status: AC
  Filled 2018-05-31: qty 5

## 2018-05-31 MED ORDER — PROMETHAZINE HCL 25 MG/ML IJ SOLN: 6.2500 mg | INTRAMUSCULAR | Status: DC | PRN

## 2018-05-31 MED ORDER — OXYCODONE HCL 5 MG PO TABS
5.0000 mg | ORAL_TABLET | Freq: Four times a day (QID) | ORAL | 0 refills | Status: DC | PRN
Start: 2018-05-31 — End: 2019-02-14

## 2018-05-31 MED ORDER — BUPIVACAINE HCL (PF) 0.5 % IJ SOLN
INTRAMUSCULAR | Status: AC
  Filled 2018-05-31: qty 30

## 2018-05-31 MED ORDER — DEXAMETHASONE SODIUM PHOSPHATE 10 MG/ML IJ SOLN
INTRAMUSCULAR | Status: AC
  Filled 2018-05-31: qty 1

## 2018-05-31 MED ORDER — FENTANYL CITRATE (PF) 100 MCG/2ML IJ SOLN: 50.0000 ug | INTRAMUSCULAR | Status: DC | PRN

## 2018-05-31 MED ORDER — GABAPENTIN 300 MG PO CAPS: 300.0000 mg | ORAL_CAPSULE | ORAL | Status: DC

## 2018-05-31 MED ORDER — FENTANYL CITRATE (PF) 100 MCG/2ML IJ SOLN
25.0000 ug | INTRAMUSCULAR | Status: DC | PRN
  Administered 2018-05-31: 25 ug via INTRAVENOUS
  Administered 2018-05-31: 50 ug via INTRAVENOUS

## 2018-05-31 MED ORDER — MIDAZOLAM HCL 2 MG/2ML IJ SOLN: 1.0000 mg | INTRAMUSCULAR | Status: DC | PRN

## 2018-05-31 MED ORDER — KETOROLAC TROMETHAMINE 30 MG/ML IJ SOLN
INTRAMUSCULAR | Status: DC | PRN
  Administered 2018-05-31: 30 mg via INTRAVENOUS

## 2018-05-31 MED ORDER — ONDANSETRON HCL 4 MG/2ML IJ SOLN
INTRAMUSCULAR | Status: AC
  Filled 2018-05-31: qty 2

## 2018-05-31 MED ORDER — BUPIVACAINE LIPOSOME 1.3 % IJ SUSP: 20.0000 mL | Freq: Once | INTRAMUSCULAR | Status: DC

## 2018-05-31 MED ORDER — DEXAMETHASONE SODIUM PHOSPHATE 10 MG/ML IJ SOLN
INTRAMUSCULAR | Status: DC | PRN
  Administered 2018-05-31: 10 mg via INTRAVENOUS

## 2018-05-31 MED ORDER — OXYCODONE HCL 5 MG/5ML PO SOLN: 5.0000 mg | Freq: Once | ORAL | Status: AC | PRN

## 2018-05-31 MED ORDER — ROCURONIUM BROMIDE 10 MG/ML (PF) SYRINGE
PREFILLED_SYRINGE | INTRAVENOUS | Status: AC
  Filled 2018-05-31: qty 10

## 2018-05-31 MED ORDER — SCOPOLAMINE 1 MG/3DAYS TD PT72: 1.0000 | MEDICATED_PATCH | Freq: Once | TRANSDERMAL | Status: DC | PRN

## 2018-05-31 MED ORDER — FENTANYL CITRATE (PF) 100 MCG/2ML IJ SOLN
INTRAMUSCULAR | Status: DC | PRN
  Administered 2018-05-31: 100 ug via INTRAVENOUS

## 2018-05-31 MED ORDER — MIDAZOLAM HCL 2 MG/2ML IJ SOLN
INTRAMUSCULAR | Status: AC
  Filled 2018-05-31: qty 2

## 2018-05-31 MED ORDER — MEPERIDINE HCL 25 MG/ML IJ SOLN: 6.2500 mg | INTRAMUSCULAR | Status: DC | PRN

## 2018-05-31 MED ORDER — FENTANYL CITRATE (PF) 100 MCG/2ML IJ SOLN
INTRAMUSCULAR | Status: AC
  Filled 2018-05-31: qty 2

## 2018-05-31 MED ORDER — CELECOXIB 200 MG PO CAPS
ORAL_CAPSULE | ORAL | Status: AC
  Filled 2018-05-31: qty 1

## 2018-05-31 MED ORDER — CHLORHEXIDINE GLUCONATE CLOTH 2 % EX PADS: 6.0000 | MEDICATED_PAD | Freq: Once | CUTANEOUS | Status: DC

## 2018-05-31 MED ORDER — ROCURONIUM BROMIDE 50 MG/5ML IV SOSY
PREFILLED_SYRINGE | INTRAVENOUS | Status: DC | PRN
  Administered 2018-05-31: 50 mg via INTRAVENOUS

## 2018-05-31 MED ORDER — CIPROFLOXACIN IN D5W 400 MG/200ML IV SOLN
400.0000 mg | INTRAVENOUS | Status: AC
  Administered 2018-05-31: 400 mg via INTRAVENOUS

## 2018-05-31 MED ORDER — OXYCODONE HCL 5 MG PO TABS
ORAL_TABLET | ORAL | Status: AC
  Filled 2018-05-31: qty 1

## 2018-05-31 MED ORDER — LIDOCAINE 2% (20 MG/ML) 5 ML SYRINGE
INTRAMUSCULAR | Status: DC | PRN
  Administered 2018-05-31: 100 mg via INTRAVENOUS

## 2018-05-31 MED ORDER — KETOROLAC TROMETHAMINE 30 MG/ML IJ SOLN: 30.0000 mg | Freq: Once | INTRAMUSCULAR | Status: DC | PRN

## 2018-05-31 MED ORDER — LACTATED RINGERS IV SOLN
INTRAVENOUS | Status: DC
  Administered 2018-05-31 (×2): via INTRAVENOUS

## 2018-05-31 MED ORDER — ONDANSETRON HCL 4 MG/2ML IJ SOLN
INTRAMUSCULAR | Status: DC | PRN
  Administered 2018-05-31: 4 mg via INTRAVENOUS

## 2018-05-31 MED ORDER — CIPROFLOXACIN IN D5W 400 MG/200ML IV SOLN
INTRAVENOUS | Status: AC
  Filled 2018-05-31: qty 200

## 2018-05-31 SURGICAL SUPPLY — 34 items
APPLIER CLIP LOGIC TI 5 (MISCELLANEOUS) IMPLANT
BLADE CLIPPER SURG (BLADE) ×3 IMPLANT
CHLORAPREP W/TINT 26ML (MISCELLANEOUS) ×3 IMPLANT
DERMABOND ADVANCED (GAUZE/BANDAGES/DRESSINGS) ×1
DERMABOND ADVANCED .7 DNX12 (GAUZE/BANDAGES/DRESSINGS) ×2 IMPLANT
DEVICE SECURE STRAP 25 ABSORB (INSTRUMENTS) ×3 IMPLANT
DISSECT BALLN SPACEMKR + OVL (BALLOONS) ×3
DISSECTOR BALLN SPACEMKR + OVL (BALLOONS) ×2 IMPLANT
DISSECTOR BLUNT TIP ENDO 5MM (MISCELLANEOUS) IMPLANT
ELECT REM PT RETURN 9FT ADLT (ELECTROSURGICAL) ×3
ELECTRODE REM PT RTRN 9FT ADLT (ELECTROSURGICAL) ×2 IMPLANT
GLOVE BIOGEL PI IND STRL 7.0 (GLOVE) ×4 IMPLANT
GLOVE BIOGEL PI INDICATOR 7.0 (GLOVE) ×2
GLOVE ECLIPSE 6.5 STRL STRAW (GLOVE) ×3 IMPLANT
GLOVE SURG SIGNA 7.5 PF LTX (GLOVE) ×6 IMPLANT
GOWN STRL REUS W/ TWL LRG LVL3 (GOWN DISPOSABLE) ×2 IMPLANT
GOWN STRL REUS W/ TWL XL LVL3 (GOWN DISPOSABLE) ×2 IMPLANT
GOWN STRL REUS W/TWL LRG LVL3 (GOWN DISPOSABLE) ×1
GOWN STRL REUS W/TWL XL LVL3 (GOWN DISPOSABLE) ×1
MESH 3DMAX 4X6 LT LRG (Mesh General) ×3 IMPLANT
MESH 3DMAX 4X6 RT LRG (Mesh General) ×3 IMPLANT
NEEDLE INSUFFLATION 14GA 120MM (NEEDLE) IMPLANT
NS IRRIG 1000ML POUR BTL (IV SOLUTION) IMPLANT
PACK BASIN DAY SURGERY FS (CUSTOM PROCEDURE TRAY) ×3 IMPLANT
SET IRRIG TUBING LAPAROSCOPIC (IRRIGATION / IRRIGATOR) IMPLANT
SET TROCAR LAP APPLE-HUNT 5MM (ENDOMECHANICALS) ×3 IMPLANT
SLEEVE SCD COMPRESS KNEE MED (MISCELLANEOUS) ×3 IMPLANT
SUT MNCRL AB 4-0 PS2 18 (SUTURE) ×3 IMPLANT
TOWEL GREEN STERILE FF (TOWEL DISPOSABLE) ×3 IMPLANT
TRAY FOL W/BAG SLVR 16FR STRL (SET/KITS/TRAYS/PACK) IMPLANT
TRAY FOLEY W/BAG SLVR 14FR LF (SET/KITS/TRAYS/PACK) IMPLANT
TRAY FOLEY W/BAG SLVR 16FR LF (SET/KITS/TRAYS/PACK)
TRAY LAPAROSCOPIC (CUSTOM PROCEDURE TRAY) ×3 IMPLANT
TUBING INSUFFLATION (TUBING) ×3 IMPLANT

## 2018-05-31 NOTE — Discharge Instructions (Signed)
OK TO SHOWER STARTING TOMORROW  NO LIFTING MORE THAN 15 POUNDS FOR 3 WEEKS  ICE PACK, TYLENOL, IBUPROFEN ALSO FOR PAIN (NO IBUPROFEN UNTIL 10:00pm IF NEEDED)     CCS _______[facility]  UMBILICAL OR INGUINAL HERNIA REPAIR: POST OP INSTRUCTIONS  Always review your discharge instruction sheet given to you by the facility where your surgery was performed. IF YOU HAVE DISABILITY OR FAMILY LEAVE FORMS, YOU MUST BRING THEM TO THE OFFICE FOR PROCESSING.   DO NOT GIVE THEM TO YOUR DOCTOR.  1. A  prescription for pain medication may be given to you upon discharge.  Take your pain medication as prescribed, if needed.  If narcotic pain medicine is not needed, then you may take acetaminophen (Tylenol) or ibuprofen (Advil) as needed. 2. Take your usually prescribed medications unless otherwise directed. If you need a refill on your pain medication, please contact your pharmacy.  They will contact our office to request authorization. Prescriptions will not be filled after 5 pm or on week-ends. 3. You should follow a light diet the first 24 hours after arrival home, such as soup and crackers, etc.  Be sure to include lots of fluids daily.  Resume your normal diet the day after surgery. 4.Most patients will experience some swelling and bruising around the umbilicus or in the groin and scrotum.  Ice packs and reclining will help.  Swelling and bruising can take several days to resolve.  6. It is common to experience some constipation if taking pain medication after surgery.  Increasing fluid intake and taking a stool softener (such as Colace) will usually help or prevent this problem from occurring.  A mild laxative (Milk of Magnesia or Miralax) should be taken according to package directions if there are no bowel movements after 48 hours. 7. Unless discharge instructions indicate otherwise, you may remove your bandages 24-48 hours after surgery, and you may shower at that time.  You may have  steri-strips (small skin tapes) in place directly over the incision.  These strips should be left on the skin for 7-10 days.  If your surgeon used skin glue on the incision, you may shower in 24 hours.  The glue will flake off over the next 2-3 weeks.  Any sutures or staples will be removed at the office during your follow-up visit. 8. ACTIVITIES:  You may resume regular (light) daily activities beginning the next day--such as daily self-care, walking, climbing stairs--gradually increasing activities as tolerated.  You may have sexual intercourse when it is comfortable.  Refrain from any heavy lifting or straining until approved by your doctor.  a.You may drive when you are no longer taking prescription pain medication, you can comfortably wear a seatbelt, and you can safely maneuver your car and apply brakes. b.RETURN TO WORK:   _____________________________________________  9.You should see your doctor in the office for a follow-up appointment approximately 2-3 weeks after your surgery.  Make sure that you call for this appointment within a day or two after you arrive home to insure a convenient appointment time. 10.OTHER INSTRUCTIONS: _________________________    _____________________________________  WHEN TO CALL YOUR DOCTOR: 1. Fever over 101.0 2. Inability to urinate 3. Nausea and/or vomiting 4. Extreme swelling or bruising 5. Continued bleeding from incision. 6. Increased pain, redness, or drainage from the incision  The clinic staff is available to answer your questions during regular business hours.  Please dont hesitate to call and ask to speak to one of the nurses for clinical  concerns.  If you have a medical emergency, go to the nearest emergency room or call 911.  A surgeon from [facility] is always on call at the hospital   [address] [place], [place], [place]  [address] ?  [address], [place], [place]   [address] [phone] ? [phone] ? FAX  [phone] Web site: [email or web address]   Post Anesthesia Home Care Instructions  Activity: Get plenty of rest for the remainder of the day. A responsible individual must stay with you for 24 hours following the procedure.  For the next 24 hours, DO NOT: -Drive a car -Paediatric nurse -Drink alcoholic beverages -Take any medication unless instructed by your physician -Make any legal decisions or sign important papers.  Meals: Start with liquid foods such as gelatin or soup. Progress to regular foods as tolerated. Avoid greasy, spicy, heavy foods. If nausea and/or vomiting occur, drink only clear liquids until the nausea and/or vomiting subsides. Call your physician if vomiting continues.  Special Instructions/Symptoms: Your throat may feel dry or sore from the anesthesia or the breathing tube placed in your throat during surgery. If this causes discomfort, gargle with warm salt water. The discomfort should disappear within 24 hours.  If you had a scopolamine patch placed behind your ear for the management of post- operative nausea and/or vomiting:  1. The medication in the patch is effective for 72 hours, after which it should be removed.  Wrap patch in a tissue and discard in the trash. Wash hands thoroughly with soap and water. 2. You may remove the patch earlier than 72 hours if you experience unpleasant side effects which may include dry mouth, dizziness or visual disturbances. 3. Avoid touching the patch. Wash your hands with soap and water after contact with the patch.   Information for Discharge Teaching: EXPAREL (bupivacaine liposome injectable suspension)   Your surgeon gave you EXPAREL(bupivacaine) in your surgical incision to help control your pain after surgery.   EXPAREL is a local anesthetic that provides pain relief by numbing the tissue around the surgical site.  EXPAREL is designed to release pain medication over time and can control pain for up to  72 hours.  Depending on how you respond to EXPAREL, you may require less pain medication during your recovery.  Possible side effects:  Temporary loss of sensation or ability to move in the area where bupivacaine was injected.  Nausea, vomiting, constipation  Rarely, numbness and tingling in your mouth or lips, lightheadedness, or anxiety may occur.  Call your doctor right away if you think you may be experiencing any of these sensations, or if you have other questions regarding possible side effects.  Follow all other discharge instructions given to you by your surgeon or nurse. Eat a healthy diet and drink plenty of water or other fluids.  If you return to the hospital for any reason within 96 hours following the administration of EXPAREL, please inform your health care providers.

## 2018-05-31 NOTE — Anesthesia Preprocedure Evaluation (Signed)
Anesthesia Evaluation  Patient identified by MRN, date of birth, ID band Patient awake    Reviewed: Allergy & Precautions, NPO status , Patient's Chart, lab work & pertinent test results  Airway Mallampati: I  TM Distance: >3 FB Neck ROM: Full    Dental  (+) Teeth Intact   Pulmonary neg pulmonary ROS,    Pulmonary exam normal breath sounds clear to auscultation       Cardiovascular hypertension, Pt. on medications Normal cardiovascular exam Rhythm:Regular Rate:Normal     Neuro/Psych negative neurological ROS  negative psych ROS   GI/Hepatic negative GI ROS, Neg liver ROS,   Endo/Other  negative endocrine ROS  Renal/GU negative Renal ROS     Musculoskeletal negative musculoskeletal ROS (+)   Abdominal   Peds  Hematology negative hematology ROS (+)   Anesthesia Other Findings   Reproductive/Obstetrics                             Anesthesia Physical Anesthesia Plan  ASA: II  Anesthesia Plan: General   Post-op Pain Management:    Induction: Intravenous  PONV Risk Score and Plan: 2  Airway Management Planned: Oral ETT  Additional Equipment:   Intra-op Plan:   Post-operative Plan: Extubation in OR  Informed Consent: I have reviewed the patients History and Physical, chart, labs and discussed the procedure including the risks, benefits and alternatives for the proposed anesthesia with the patient or authorized representative who has indicated his/her understanding and acceptance.   Dental advisory given  Plan Discussed with: CRNA  Anesthesia Plan Comments:         Anesthesia Quick Evaluation

## 2018-05-31 NOTE — Interval H&P Note (Signed)
History and Physical Interval Note:no change in H and P  05/31/2018 1:45 PM  Ralph [name]  has presented today for surgery, with the diagnosis of Bilateral inguinal hernia  The various methods of treatment have been discussed with the patient and family. After consideration of risks, benefits and other options for treatment, the patient has consented to  Procedure(s): [place] (Bilateral) INSERTION OF MESH (Bilateral) as a surgical intervention .  The patient's history has been reviewed, patient examined, no change in status, stable for surgery.  I have reviewed the patient's chart and labs.  Questions were answered to the patient's satisfaction.     [name] A

## 2018-05-31 NOTE — Transfer of Care (Signed)
Immediate Anesthesia Transfer of Care Note  Patient: Ralph [name]  Procedure(s) Performed: LAPAROSCOPIC BILATERAL INGUINAL HERNIA REPAIR ERAS PATHWAY (Bilateral Abdomen) INSERTION OF MESH (Bilateral )  Patient Location: PACU  Anesthesia Type:General  Level of Consciousness: awake, alert  and oriented  Airway & Oxygen Therapy: Patient Spontanous Breathing and Patient connected to face mask oxygen  Post-op Assessment: Report given to RN and Post -op Vital signs reviewed and stable  Post vital signs: Reviewed and stable  Last Vitals:  Vitals Value Taken Time  BP    Temp    Pulse    Resp    SpO2      Last Pain:  Vitals:   05/31/18 1351  TempSrc:   PainSc: 2       Patients Stated Pain Goal: 2 (44/69/50 7225)  Complications: No apparent anesthesia complications

## 2018-05-31 NOTE — Anesthesia Procedure Notes (Signed)
Procedure Name: Intubation Date/Time: 05/31/2018 3:16 PM Performed by: Genelle Bal, CRNA Pre-anesthesia Checklist: Patient identified, Emergency Drugs available, Suction available and Patient being monitored Patient Re-evaluated:Patient Re-evaluated prior to induction Oxygen Delivery Method: Circle system utilized Preoxygenation: Pre-oxygenation with 100% oxygen Induction Type: IV induction Ventilation: Mask ventilation without difficulty Laryngoscope Size: Miller and 2 Grade View: Grade I Tube type: Oral Tube size: 8.0 mm Number of attempts: 1 Airway Equipment and Method: Stylet Placement Confirmation: ETT inserted through vocal cords under direct vision,  positive ETCO2 and breath sounds checked- equal and bilateral Secured at: 22 cm Tube secured with: Tape Dental Injury: Teeth and Oropharynx as per pre-operative assessment

## 2018-05-31 NOTE — Op Note (Signed)
LAPAROSCOPIC BILATERAL INGUINAL HERNIA REPAIR ERAS PATHWAY, INSERTION OF MESH  Procedure Note  Ralph [name] 05/31/2018   Pre-op Diagnosis: Bilateral inguinal hernia     Post-op Diagnosis: same  Procedure(s): LAPAROSCOPIC BILATERAL INGUINAL HERNIA REPAIR ERAS PATHWAY INSERTION OF MESH (bilateral large 3D-Max prolene mesh)  Surgeon(s): Coralie Keens, MD  Anesthesia: General  Staff:  Circulator: Lynelle Doctor, RN Relief Circulator: Humberto Seals, RN Scrub Person: Silvio Clayman, CST  Estimated Blood Loss: Minimal                Findings: The patient was found to have [name] large left indirect inguinal hernia as well as [name] moderate left-sided direct inguinal hernia.  There was [name] much smaller right sided direct inguinal hernia.  Procedure: The patient was brought to the operating room and identified as the correct patient.  He was placed upon the operating table and general anesthesia was induced.  His abdomen was then prepped and draped in usual sterile fashion.  I made [name] small transverse incision just below the umbilicus with [name] scalpel.  I carried this down to the fascia which was opened just to the right of the midline.  The rectus muscle was elevated.  The dissecting balloon was then passed underneath the rectus muscle and directed toward the pubis.  The dissecting balloon was then insufflated under direct vision dissecting out the preperitoneal space.  The dissecting balloon was then removed and insufflation was begun with carbon dioxide.  I placed two 5 mm trochars in the patient's lower midline both under direct vision.  I dissected out the right inguinal area first.  The patient had [name] small indirect right inguinal hernia without evidence of left inguinal hernia.  I then turned my attention toward the left side.  The patient had [name] large indirect hernia sac which I was able to reduce from the cord structures.  There is [name] lipoma the cord as well.  There is also [name] moderate-sized  direct hernia defect.  I next brought [name] piece of large Prolene 3 DMax mesh from [employer] onto the field.  [name] left-sided piece of mesh was first placed through the umbilical trocar and opened as an onlay on the left inguinal floor.  I used the absorbable tacker to tack it to Cooper's ligament, up the medial abdominal wall, and slightly laterally.  Wide coverage of the cord structures and direct defect appeared to be achieved.  I then brought [name] right-sided piece of [employer] 3 DMax Prolene mesh onto the field.  It was placed to the umbilical trocar and then opened as an onlay on the right inguinal floor.  I then tacked to Cooper's ligament, of the medial abdominal wall, and slightly laterally as well.  Applied coverage of the direct defect and cord structures appear to be achieved.  Hemostasis appeared to be achieved.  I then stopped the insufflation in the preperitoneal space collapse appropriately with the mesh in place.  All trochars were removed under direct vision the abdomen was deflated.  I closed the fascia the umbilicus with [name] figure-of-eight 0 Vicryl suture.  I believe there might of been [name] small umbilical hernia defect so I reinforced the umbilicus with [name] 0 Vicryl suture as well.  All incisions were then anesthetized with Marcaine.  I performed bilateral ilioinguinal nerve blocks with Marcaine as well.  All incisions were then closed with 4-0 Vicryl sutures and Dermabond.  The patient tolerated procedure well.  All the counts were correct at the  end of procedure.  The patient was then extubated in the operating room and taken in [name] stable condition to the recovery room.          Ralph [name]   Date: 05/31/2018  Time: 4:05 PM

## 2018-06-01 ENCOUNTER — Encounter (HOSPITAL_BASED_OUTPATIENT_CLINIC_OR_DEPARTMENT_OTHER): Payer: Self-pay | Admitting: Surgery

## 2018-06-02 ENCOUNTER — Telehealth: Payer: Self-pay | Admitting: Internal Medicine

## 2018-06-02 NOTE — Telephone Encounter (Signed)
Spoke w/ Pt- informed we have been receiving records- have not received cscope report yet- but can take several weeks to receive whether pathology needed to be completed or not. Pt verbalized understanding.

## 2018-06-02 NOTE — Telephone Encounter (Signed)
Copied from [place] [phone]. Topic: Quick Communication - See Telephone Encounter >> Jun 02, 2018  1:07 PM Neva Seat wrote: Pt wanting to know if Dr. Larose Kells has reveived the records on the following below. Please call pt back to let him know asap. If Dr Larose Kells hasn't received them he will then have the records sent over.  Colonoscopy  Fri. 6-7 - Dr. Anson Fret w/ [facility] on Mon. 6-17 - Dr. Rhunette Croft  w/ [facility]

## 2018-06-02 NOTE — Anesthesia Postprocedure Evaluation (Signed)
Anesthesia Post Note  Patient: Ralph [name]  Procedure(s) Performed: LAPAROSCOPIC BILATERAL INGUINAL HERNIA REPAIR ERAS PATHWAY (Bilateral Abdomen) INSERTION OF MESH (Bilateral )     Patient location during evaluation: PACU Anesthesia Type: General Level of consciousness: sedated and patient cooperative Pain management: pain level controlled Vital Signs Assessment: post-procedure vital signs reviewed and stable Respiratory status: spontaneous breathing Cardiovascular status: stable Anesthetic complications: no    Last Vitals:  Vitals:   05/31/18 1700 05/31/18 1721  BP: (!) 144/86 (!) 143/79  Pulse: 60 62  Resp: 20 18  Temp:  36.7 C  SpO2: 100% 100%    Last Pain:  Vitals:   06/01/18 0932  TempSrc:   PainSc: 2    Pain Goal: Patients Stated Pain Goal: 2 (05/31/18 1351)               Nolon Nations

## 2018-06-08 ENCOUNTER — Telehealth: Payer: Self-pay | Admitting: Internal Medicine

## 2018-06-08 ENCOUNTER — Encounter: Payer: Self-pay | Admitting: Internal Medicine

## 2018-06-08 ENCOUNTER — Ambulatory Visit (INDEPENDENT_AMBULATORY_CARE_PROVIDER_SITE_OTHER): Payer: BLUE CROSS/BLUE SHIELD | Admitting: Internal Medicine

## 2018-06-08 VITALS — BP 122/70 | HR 54 | Temp 97.7°F | Resp 16 | Ht 69.0 in | Wt 184.2 lb

## 2018-06-08 DIAGNOSIS — R739 Hyperglycemia, unspecified: Secondary | ICD-10-CM

## 2018-06-08 DIAGNOSIS — Z0000 Encounter for general adult medical examination without abnormal findings: Secondary | ICD-10-CM

## 2018-06-08 DIAGNOSIS — E785 Hyperlipidemia, unspecified: Secondary | ICD-10-CM | POA: Diagnosis not present

## 2018-06-08 LAB — LIPID PANEL
CHOL/HDL RATIO: 3
Cholesterol: 166 mg/dL (ref 0–200)
HDL: 51.8 mg/dL (ref >39.00)
LDL CALC: 98 mg/dL (ref 0–99)
NONHDL: 113.87
Triglycerides: 81 mg/dL (ref 0.0–149.0)
VLDL: 16.2 mg/dL (ref 0.0–40.0)

## 2018-06-08 LAB — TSH: TSH: 2.43 u[IU]/mL (ref 0.35–4.50)

## 2018-06-08 LAB — PSA: PSA: 0.5 ng/mL (ref 0.10–4.00)

## 2018-06-08 LAB — HEMOGLOBIN A1C: Hgb A1c MFr Bld: 5.5 % (ref 4.6–6.5)

## 2018-06-08 NOTE — Progress Notes (Signed)
Pre visit review using our clinic review tool, if applicable. No additional management support is needed unless otherwise documented below in the visit note. 

## 2018-06-08 NOTE — Assessment & Plan Note (Signed)
HTN: He change his diet and has lost around 10 pounds on his own scales, BPs has been in the low side and he was feeling dizzy, he self decrease metoprolol doses. Plan: Continue metoprolol but only half tablet daily, losartan, monitor BPs, consider stop metoprolol.  See AVS Hyperlipidemia: On Lipitor, checking a FLP LLQ abdominal pain: See last visit, since then had a normal colonoscopy and eventually saw surgery, had bilateral hernia repair and umbilical hernia repair  RTC 1 year

## 2018-06-08 NOTE — Patient Instructions (Signed)
GO TO THE LAB : Get the blood work     GO TO THE FRONT DESK Schedule your next appointment for a  Physical exam in 1 year   Decrease metoprolol to half tablet a day, continue other meds, consider drop metoprolol if BPs are low, let me know   Check the  blood pressure 2 or 3 times a   Week Be sure your blood pressure is between 110/65 and  135/85. If it is consistently higher or lower, let me know

## 2018-06-08 NOTE — Progress Notes (Signed)
Subjective:    Patient ID: Ralph [name], male    DOB: 1951/04/23, 67 y.o.   MRN: [id number]  DOS:  06/08/2018 Type of visit - description : cpx Interval history: Since the last office visit, had   a colonoscopy and surgery Diet has improved, has lost weight, self decreased metoprolol to half tablet daily and has a skip BB some days  because BP was in the  110 he was feeling somewhat dizzy   Review of Systems Still sore from surgery.  On Oxycodon.  Other than above, a 14 point review of systems is negative      Past Medical History:  Diagnosis Date  . Hyperlipidemia 2007  . Hypertension 2007  . Inguinal hernia bilateral, non-recurrent   . Normal cardiac stress test 2008   per pt    Past Surgical History:  Procedure Laterality Date  . INGUINAL HERNIA REPAIR Bilateral 05/31/2018   Procedure: LAPAROSCOPIC BILATERAL INGUINAL HERNIA REPAIR ERAS PATHWAY;  Surgeon: Coralie Keens, MD;  Location: [facility];  Service: General;  Laterality: Bilateral;  . INSERTION OF MESH Bilateral 05/31/2018   Procedure: INSERTION OF MESH;  Surgeon: Coralie Keens, MD;  Location: [facility];  Service: General;  Laterality: Bilateral;    Social History   Socioeconomic History  . Marital status: Divorced    Spouse name: Not on file  . Number of children: 1  . Years of education: Not on file  . Highest education level: Not on file  Occupational History  . Occupation: Press photographer, hearing aid  Social Needs  . Financial resource strain: Not on file  . Food insecurity:    Worry: Not on file    Inability: Not on file  . Transportation needs:    Medical: Not on file    Non-medical: Not on file  Tobacco Use  . Smoking status: Never Smoker  . Smokeless tobacco: Never Used  Substance and Sexual Activity  . Alcohol use: Yes    Comment: socially  . Drug use: No  . Sexual activity: Yes  Lifestyle  . Physical activity:    Days per week: Not on file    Minutes per  session: Not on file  . Stress: Not on file  Relationships  . Social connections:    Talks on phone: Not on file    Gets together: Not on file    Attends religious service: Not on file    Active member of club or organization: Not on file    Attends meetings of clubs or organizations: Not on file    Relationship status: Not on file  . Intimate partner violence:    Fear of current or ex partner: Not on file    Emotionally abused: Not on file    Physically abused: Not on file    Forced sexual activity: Not on file  Other Topics Concern  . Not on file  Social History Narrative   Original from [place] w/  girlfriend part time               Family History  Problem Relation Age of Onset  . Breast cancer Mother   . Bone cancer Mother   . Coronary artery disease Father        w/ first MI age 30, GF MI in the early 99s  . Hypertension Sister   . Prostate cancer Neg Hx   . Colon cancer Neg Hx   . Diabetes Neg  Hx      Allergies as of 06/08/2018      Reactions   Penicillins       Medication List        Accurate as of 06/08/18  5:49 PM. Always use your most recent med list.          aspirin 81 MG tablet Take 81 mg by mouth daily.   atorvastatin 40 MG tablet Commonly known as:  LIPITOR Take 0.5 tablets (20 mg total) by mouth daily.   fluticasone 50 MCG/ACT nasal spray Commonly known as:  FLONASE Place 2 sprays into both nostrils daily as needed for allergies or rhinitis.   losartan 100 MG tablet Commonly known as:  COZAAR Take 1 tablet (100 mg total) by mouth daily.   metoprolol succinate 50 MG 24 hr tablet Commonly known as:  TOPROL-XL Take 1/2 tablet by mouth daily   oxyCODONE 5 MG immediate release tablet Commonly known as:  Oxy IR/ROXICODONE Take 1 tablet (5 mg total) by mouth every 6 (six) hours as needed for moderate pain or severe pain.          Objective:   Physical Exam BP 122/70 (BP Location: Left Arm, Patient Position: Sitting, Cuff  Size: Small)   Pulse (!) 54   Temp 97.7 F (36.5 C) (Oral)   Resp 16   Ht 5\' 9"  (1.753 m)   Wt 184 lb 4 oz (83.6 kg)   SpO2 98%   BMI 27.21 kg/m  General: Well developed, NAD, see BMI.  Neck: No  thyromegaly  HEENT:  Normocephalic . Face symmetric, atraumatic Lungs:  CTA B Normal respiratory effort, no intercostal retractions, no accessory muscle use. Heart: RRR,  no murmur.  No pretibial edema bilaterally  Abd: not examined  Skin: Exposed areas without rash. Not pale. Not jaundice Neurologic:  alert & oriented X3.  Speech normal, gait appropriate for age and unassisted Strength symmetric and appropriate for age.  Psych: Cognition and judgment appear intact.  Cooperative with normal attention span and concentration.  Behavior appropriate. No anxious or depressed appearing.     Assessment & Plan:   Assessment HTN Hyperlipidemia Tinnitus (used to take xanas very rarely) E.D. Normal stress test 2008 per patient + FH CAD: Aspirin daily  Plan:  HTN: He change his diet and has lost around 10 pounds on his own scales, BPs has been in the low side and he was feeling dizzy, he self decrease metoprolol doses. Plan: Continue metoprolol but only half tablet daily, losartan, monitor BPs, consider stop metoprolol.  See AVS Hyperlipidemia: On Lipitor, checking a FLP LLQ abdominal pain: See last visit, since then had a normal colonoscopy and eventually saw surgery, had bilateral hernia repair and umbilical hernia repair  RTC 1 year

## 2018-06-08 NOTE — Assessment & Plan Note (Addendum)
--  Td 2012; zostavax: 2012; shingrix not available --Prostate ca screening:   DRE done d/t abd pain 04/2018: wnl, pt request a PSA   --Cscope 01-2006, saw [facility] 04/2018, Cscope June 06/2018, benign bx, 10 years per GI letter to the patient --Diet-exercise-doing great with lifestyle, has lost wt.praised  --Recent labs reviewed, due for FLP, A1c, TSH, PSA

## 2018-06-08 NOTE — Telephone Encounter (Signed)
I am sorry he feels that way but a office visit is for me to provide an opinion which I did, cannot delete the charges. Please ask office manager to handle the issue.

## 2018-06-08 NOTE — Telephone Encounter (Signed)
Patient came in on 06-08-2018, Patient brought a copy of bill in from 04-28-2018, Patient stts he came in three times within the month of May which of the following dates 04-14-2018, 04-28-2018, 05-06-2018. Patient stts no PCP caught the problem which he stts he knew he had the problem the whole time. Patient feels that he should not be responsible for theses charges. Please call patient at @ [phone]. Patient feels he should also be refunded  From the first 2 visits. Patient bill was forward to office manager box outside door and also front office supervisor.    Patient was also provided office manager card.

## 2018-09-21 ENCOUNTER — Other Ambulatory Visit: Payer: Self-pay | Admitting: Surgery

## 2018-10-01 NOTE — Pre-Procedure Instructions (Signed)
Ralph [name]  10/01/2018      [id number] - [place], [place] - [place] [address] Phone: [phone] Fax: [phone]    Your procedure is scheduled on Thursday, Oct. 24th   Report to [facility] Admitting at 7:00 AM             (posted surgery time 9a - 10a)   Call this number if you have problems the morning of surgery:  [phone]   Remember:   Do not eat any foods or drink any liquids after midnight, Wednesday.   7 days prior to surgery, STOP TAKING any Vitamins, Herbal Supplements, Anti-inflammatories.    Take these medicines the morning of surgery with A SIP OF WATER : NONE    Do not wear jewelry - no rings or watches.  Do not wear lotions, colognes or deodorant.   Men may shave face and neck.   Do not bring valuables to the hospital.  [facility] is not responsible for any belongings or valuables.  Contacts, dentures or bridgework may not be worn into surgery.  Leave your suitcase in the car.  After surgery it may be brought to your room.  For patients admitted to the hospital, discharge time will be determined by your treatment team.  Patients discharged the day of surgery will not be allowed to drive home.  You will need someone to stay with you for the first 24 hrs.  Please read over the following fact sheets that you were given. Surgical Site Infection Prevention       [place]- Preparing For Surgery  Before surgery, you can play an important role. Because skin is not sterile, your skin needs to be as free of germs as possible. You can reduce the number of germs on your skin by washing with CHG (chlorahexidine gluconate) Soap before surgery.  CHG is an antiseptic cleaner which kills germs and bonds with the skin to continue killing germs even after washing.    Oral Hygiene is also important to reduce your risk of infection.    Remember - BRUSH YOUR TEETH THE MORNING OF SURGERY WITH YOUR REGULAR  TOOTHPASTE  Please do not use if you have an allergy to CHG or antibacterial soaps. If your skin becomes reddened/irritated stop using the CHG.  Do not shave (including legs and underarms) for at least 48 hours prior to first CHG shower. It is OK to shave your face.  Please follow these instructions carefully.   1. Shower the NIGHT BEFORE SURGERY and the MORNING OF SURGERY with CHG.   2. If you chose to wash your hair, wash your hair first as usual with your normal shampoo.  3. After you shampoo, rinse your hair and body thoroughly to remove the shampoo.  4. Use CHG as you would any other liquid soap. You can apply CHG directly to the skin and wash gently with a scrungie or a clean washcloth.   5. Apply the CHG Soap to your body ONLY FROM THE NECK DOWN.  Do not use on open wounds or open sores. Avoid contact with your eyes, ears, mouth and genitals (private parts). Wash Face and genitals (private parts)  with your normal soap.  6. Wash thoroughly, paying special attention to the area where your surgery will be performed.  7. Thoroughly rinse your body with warm water from the neck down.  8. DO NOT shower/wash with your normal soap after using and  rinsing off the CHG Soap.  9. Pat yourself dry with a CLEAN TOWEL.  10. Wear CLEAN PAJAMAS to bed the night before surgery, wear comfortable clothes the morning of surgery  11. Place CLEAN SHEETS on your bed the night of your first shower and DO NOT SLEEP WITH PETS.  Day of Surgery:  Do not apply any deodorants/lotions.  Please wear clean clothes to the hospital/surgery center.    Remember to brush your teeth WITH YOUR REGULAR TOOTHPASTE.

## 2018-10-04 ENCOUNTER — Encounter (HOSPITAL_COMMUNITY): Payer: Self-pay

## 2018-10-04 ENCOUNTER — Encounter (HOSPITAL_COMMUNITY)
Admission: RE | Admit: 2018-10-04 | Discharge: 2018-10-04 | Disposition: A | Discharge status: Home / Self Care | Payer: BLUE CROSS/BLUE SHIELD | Source: Ambulatory Visit | Attending: Surgery | Admitting: Surgery

## 2018-10-04 ENCOUNTER — Other Ambulatory Visit: Payer: Self-pay

## 2018-10-04 DIAGNOSIS — Z01812 Encounter for preprocedural laboratory examination: Secondary | ICD-10-CM | POA: Diagnosis present

## 2018-10-04 LAB — BASIC METABOLIC PANEL
Anion gap: 9 (ref 5–15)
BUN: 16 mg/dL (ref 8–23)
CHLORIDE: 105 mmol/L (ref 98–111)
CO2: 26 mmol/L (ref 22–32)
CREATININE: 0.9 mg/dL (ref 0.61–1.24)
Calcium: 9.3 mg/dL (ref 8.9–10.3)
GFR calc Af Amer: >60 mL/min (ref >60)
GFR calc non Af Amer: >60 mL/min (ref >60)
GLUCOSE: 109 mg/dL — AB (ref 70–99)
POTASSIUM: 3.9 mmol/L (ref 3.5–5.1)
Sodium: 140 mmol/L (ref 135–145)

## 2018-10-04 LAB — CBC
HEMATOCRIT: 43.9 % (ref 39.0–52.0)
Hemoglobin: 14.2 g/dL (ref 13.0–17.0)
MCH: 29.6 pg (ref 26.0–34.0)
MCHC: 32.3 g/dL (ref 30.0–36.0)
MCV: 91.5 fL (ref 80.0–100.0)
Platelets: 213 10*3/uL (ref 150–400)
RBC: 4.8 MIL/uL (ref 4.22–5.81)
RDW: 12 % (ref 11.5–15.5)
WBC: 6.1 10*3/uL (ref 4.0–10.5)
nRBC: 0 % (ref 0.0–0.2)

## 2018-10-04 NOTE — Progress Notes (Signed)
PCP - Dr. Kathlene November Cardiologist - Denies  Chest x-ray - N/A EKG - 05/2018 Stress Test - 2010 ECHO - 2010 Cardiac Cath - denies  Sleep Study - Pt denies ever having had a sleep study    Anesthesia review: No  Patient denies shortness of breath, fever, cough and chest pain at PAT appointment   Patient verbalized understanding of instructions that were given to them at the PAT appointment. Patient was also instructed that they will need to review over the PAT instructions again at home before surgery.

## 2018-10-06 NOTE — H&P (Signed)
   Ralph [name] Documented: 09/21/2018 3:45 PM Location: [facility] Patient #: [id number] DOB: 02-24-1951 Single / Language: Ralph [name] / Race: White Male   History of Present Illness (Ralph Nessel A. Ninfa Linden MD; 09/21/2018 4:17 PM) The patient is a 67 year old male who presents with an inguinal hernia. This is a gentleman performed bilateral laparoscopic inguinal hernia repair with mesh back in June of this year. He has been doing well until recently when he noticed a recurrent bulge in the left groin. He has had some constipation with this and discomfort. He is otherwise been healthy without complaints. He has been doing no lifting or any vigorous activity.  Past Medical History:  Diagnosis Date  . Hyperlipidemia 2007  . Hypertension 2007  . Inguinal hernia bilateral, non-recurrent   . Normal cardiac stress test 2008   per pt         Past Surgical History:  Procedure Laterality Date  . INGUINAL HERNIA REPAIR Bilateral 05/31/2018   Procedure: LAPAROSCOPIC BILATERAL INGUINAL HERNIA REPAIR ERAS PATHWAY;  Surgeon: Ralph Keens, MD;  Location: [facility];  Service: General;  Laterality: Bilateral;  . INSERTION OF MESH Bilateral 05/31/2018   Procedure: INSERTION OF MESH;  Surgeon: Ralph Keens, MD;  Location: [facility];  Service: General;  Laterality: Bilateral;    Allergies [name]; 09/21/2018 3:46 PM) No Known Drug Allergies [05/28/2018]: (Marked as Inactive) Penicillins  Allergies Reconciled   Medication History [name]; 09/21/2018 3:46 PM) Losartan Potassium (100MG  Tablet, Oral) Active. Atorvastatin Calcium (40MG  Tablet, Oral) Active. Medications Reconciled  Vitals [name]; 09/21/2018 3:46 PM) 09/21/2018 3:46 PM Weight: 182 lb Height: 69in Body Surface Area: 1.98 m Body Mass Index: 26.88 kg/m  Temp.: 98.93F(Oral)  Pulse: 73 (Regular)  BP: 114/72 (Sitting, Left Arm,  Standard)       Physical Exam (Ralph [name] A. Ninfa Linden MD; 09/21/2018 4:17 PM) The physical exam findings are as follows: Note:On exam, he has a small, easily reducible left recurrent inguinal hernia. There is no evidence of umbilical hernia or recurrent right inguinal hernia Lungs clear CV RRR Abdomen otherwise soft, NT Skin without rash    Assessment & Plan (Ralph [name] A. 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 I discussed this with him in detail. He is symptomatic so an open repair with mesh is recommended. We again discussed the risks of surgery which does include recurrence. We also discussed risk of bleeding, infection, injury to try structures, etc. As he is symptomatic, surgery will be scheduled urgently

## 2018-10-07 ENCOUNTER — Ambulatory Visit (HOSPITAL_COMMUNITY): Payer: BLUE CROSS/BLUE SHIELD

## 2018-10-07 ENCOUNTER — Encounter (HOSPITAL_COMMUNITY): Payer: Self-pay | Admitting: *Deleted

## 2018-10-07 ENCOUNTER — Ambulatory Visit (HOSPITAL_COMMUNITY)
Admission: RE | Admit: 2018-10-07 | Discharge: 2018-10-07 | Disposition: A | Discharge status: Home / Self Care | Payer: BLUE CROSS/BLUE SHIELD | Source: Ambulatory Visit | Attending: Surgery | Admitting: Surgery

## 2018-10-07 ENCOUNTER — Encounter (HOSPITAL_COMMUNITY)
Admission: RE | Disposition: A | Discharge status: Home / Self Care | Payer: Self-pay | Source: Ambulatory Visit | Attending: Surgery

## 2018-10-07 DIAGNOSIS — K4091 Unilateral inguinal hernia, without obstruction or gangrene, recurrent: Secondary | ICD-10-CM | POA: Insufficient documentation

## 2018-10-07 DIAGNOSIS — Z79899 Other long term (current) drug therapy: Secondary | ICD-10-CM | POA: Insufficient documentation

## 2018-10-07 DIAGNOSIS — E785 Hyperlipidemia, unspecified: Secondary | ICD-10-CM | POA: Diagnosis not present

## 2018-10-07 DIAGNOSIS — I10 Essential (primary) hypertension: Secondary | ICD-10-CM | POA: Diagnosis not present

## 2018-10-07 DIAGNOSIS — Z880 Allergy status to penicillin: Secondary | ICD-10-CM | POA: Insufficient documentation

## 2018-10-07 HISTORY — PX: INGUINAL HERNIA REPAIR: SHX194

## 2018-10-07 HISTORY — PX: INSERTION OF MESH: SHX5868

## 2018-10-07 SURGERY — REPAIR, HERNIA, INGUINAL, ADULT
Anesthesia: General | Site: Groin | Laterality: Left

## 2018-10-07 MED ORDER — HYDROMORPHONE HCL 1 MG/ML IJ SOLN
0.2500 mg | INTRAMUSCULAR | Status: DC | PRN
  Administered 2018-10-07 (×2): 0.5 mg via INTRAVENOUS

## 2018-10-07 MED ORDER — ONDANSETRON HCL 4 MG/2ML IJ SOLN
INTRAMUSCULAR | Status: AC
  Filled 2018-10-07: qty 2

## 2018-10-07 MED ORDER — ACETAMINOPHEN 500 MG PO TABS
ORAL_TABLET | ORAL | Status: AC
  Administered 2018-10-07: 1000 mg
  Filled 2018-10-07: qty 2

## 2018-10-07 MED ORDER — ROPIVACAINE HCL 5 MG/ML IJ SOLN
INTRAMUSCULAR | Status: DC | PRN
  Administered 2018-10-07: 30 mL via PERINEURAL

## 2018-10-07 MED ORDER — PROPOFOL 10 MG/ML IV BOLUS
INTRAVENOUS | Status: DC | PRN
  Administered 2018-10-07 (×2): 200 mg via INTRAVENOUS

## 2018-10-07 MED ORDER — FENTANYL CITRATE (PF) 250 MCG/5ML IJ SOLN
INTRAMUSCULAR | Status: DC | PRN
  Administered 2018-10-07 (×2): 25 ug via INTRAVENOUS

## 2018-10-07 MED ORDER — PROPOFOL 10 MG/ML IV BOLUS
INTRAVENOUS | Status: AC
  Filled 2018-10-07: qty 20

## 2018-10-07 MED ORDER — GABAPENTIN 300 MG PO CAPS
300.0000 mg | ORAL_CAPSULE | ORAL | Status: AC
  Administered 2018-10-07: 300 mg via ORAL

## 2018-10-07 MED ORDER — LIDOCAINE 2% (20 MG/ML) 5 ML SYRINGE
INTRAMUSCULAR | Status: DC | PRN
  Administered 2018-10-07: 80 mg via INTRAVENOUS

## 2018-10-07 MED ORDER — ACETAMINOPHEN 500 MG PO TABS: 1000.0000 mg | ORAL_TABLET | ORAL | Status: DC

## 2018-10-07 MED ORDER — LACTATED RINGERS IV SOLN
INTRAVENOUS | Status: DC
  Administered 2018-10-07: 08:00:00 via INTRAVENOUS

## 2018-10-07 MED ORDER — CIPROFLOXACIN IN D5W 400 MG/200ML IV SOLN
INTRAVENOUS | Status: AC
  Filled 2018-10-07: qty 200

## 2018-10-07 MED ORDER — DEXAMETHASONE SODIUM PHOSPHATE 10 MG/ML IJ SOLN
INTRAMUSCULAR | Status: AC
  Filled 2018-10-07: qty 1

## 2018-10-07 MED ORDER — MEPERIDINE HCL 50 MG/ML IJ SOLN: 6.2500 mg | INTRAMUSCULAR | Status: DC | PRN

## 2018-10-07 MED ORDER — ONDANSETRON HCL 4 MG/2ML IJ SOLN
INTRAMUSCULAR | Status: DC | PRN
  Administered 2018-10-07: 4 mg via INTRAVENOUS

## 2018-10-07 MED ORDER — CHLORHEXIDINE GLUCONATE CLOTH 2 % EX PADS: 6.0000 | MEDICATED_PAD | Freq: Once | CUTANEOUS | Status: DC

## 2018-10-07 MED ORDER — MIDAZOLAM HCL 2 MG/2ML IJ SOLN: 2.0000 mg | Freq: Once | INTRAMUSCULAR | Status: DC

## 2018-10-07 MED ORDER — DEXAMETHASONE SODIUM PHOSPHATE 10 MG/ML IJ SOLN
INTRAMUSCULAR | Status: DC | PRN
  Administered 2018-10-07: 10 mg via INTRAVENOUS

## 2018-10-07 MED ORDER — HYDROMORPHONE HCL 1 MG/ML IJ SOLN
INTRAMUSCULAR | Status: AC
  Filled 2018-10-07: qty 1

## 2018-10-07 MED ORDER — GABAPENTIN 300 MG PO CAPS
ORAL_CAPSULE | ORAL | Status: AC
  Filled 2018-10-07: qty 1

## 2018-10-07 MED ORDER — PROMETHAZINE HCL 25 MG/ML IJ SOLN: 6.2500 mg | INTRAMUSCULAR | Status: DC | PRN

## 2018-10-07 MED ORDER — MIDAZOLAM HCL 2 MG/2ML IJ SOLN
INTRAMUSCULAR | Status: AC
  Filled 2018-10-07: qty 2

## 2018-10-07 MED ORDER — LIDOCAINE 2% (20 MG/ML) 5 ML SYRINGE
INTRAMUSCULAR | Status: AC
  Filled 2018-10-07: qty 5

## 2018-10-07 MED ORDER — OXYCODONE HCL 5 MG PO TABS: 5.0000 mg | ORAL_TABLET | Freq: Once | ORAL | Status: DC | PRN

## 2018-10-07 MED ORDER — FENTANYL CITRATE (PF) 250 MCG/5ML IJ SOLN
INTRAMUSCULAR | Status: AC
  Filled 2018-10-07: qty 5

## 2018-10-07 MED ORDER — FENTANYL CITRATE (PF) 100 MCG/2ML IJ SOLN
INTRAMUSCULAR | Status: AC
  Administered 2018-10-07: 100 ug
  Filled 2018-10-07: qty 2

## 2018-10-07 MED ORDER — OXYCODONE HCL 5 MG/5ML PO SOLN: 5.0000 mg | Freq: Once | ORAL | Status: DC | PRN

## 2018-10-07 MED ORDER — BUPIVACAINE-EPINEPHRINE 0.5% -1:200000 IJ SOLN
INTRAMUSCULAR | Status: DC | PRN
  Administered 2018-10-07: 15 mL

## 2018-10-07 MED ORDER — 0.9 % SODIUM CHLORIDE (POUR BTL) OPTIME
TOPICAL | Status: DC | PRN
  Administered 2018-10-07: 1000 mL

## 2018-10-07 MED ORDER — FENTANYL CITRATE (PF) 100 MCG/2ML IJ SOLN: 100.0000 ug | Freq: Once | INTRAMUSCULAR | Status: DC

## 2018-10-07 MED ORDER — BUPIVACAINE-EPINEPHRINE 0.5% -1:200000 IJ SOLN
INTRAMUSCULAR | Status: AC
  Filled 2018-10-07: qty 1

## 2018-10-07 MED ORDER — PHENYLEPHRINE 40 MCG/ML (10ML) SYRINGE FOR IV PUSH (FOR BLOOD PRESSURE SUPPORT)
PREFILLED_SYRINGE | INTRAVENOUS | Status: DC | PRN
  Administered 2018-10-07: 40 ug via INTRAVENOUS

## 2018-10-07 MED ORDER — CIPROFLOXACIN IN D5W 400 MG/200ML IV SOLN
400.0000 mg | INTRAVENOUS | Status: AC
  Administered 2018-10-07: 400 mg via INTRAVENOUS

## 2018-10-07 MED ORDER — MIDAZOLAM HCL 2 MG/2ML IJ SOLN
INTRAMUSCULAR | Status: AC
  Administered 2018-10-07: 2 mg
  Filled 2018-10-07: qty 2

## 2018-10-07 SURGICAL SUPPLY — 39 items
BLADE CLIPPER SURG (BLADE) ×2 IMPLANT
BLADE SURG 10 STRL SS (BLADE) IMPLANT
BLADE SURG 15 STRL LF DISP TIS (BLADE) ×1 IMPLANT
BLADE SURG 15 STRL SS (BLADE) ×1
CHLORAPREP W/TINT 26ML (MISCELLANEOUS) ×2 IMPLANT
COVER SURGICAL LIGHT HANDLE (MISCELLANEOUS) ×2 IMPLANT
COVER WAND RF STERILE (DRAPES) IMPLANT
DERMABOND ADVANCED (GAUZE/BANDAGES/DRESSINGS) ×1
DERMABOND ADVANCED .7 DNX12 (GAUZE/BANDAGES/DRESSINGS) ×1 IMPLANT
DRAIN PENROSE 1/2X12 LTX STRL (WOUND CARE) ×2 IMPLANT
DRAPE LAPAROTOMY TRNSV 102X78 (DRAPE) ×2 IMPLANT
DRAPE UTILITY XL STRL (DRAPES) ×2 IMPLANT
ELECT CAUTERY BLADE 6.4 (BLADE) IMPLANT
ELECT REM PT RETURN 9FT ADLT (ELECTROSURGICAL) ×2
ELECTRODE REM PT RTRN 9FT ADLT (ELECTROSURGICAL) ×1 IMPLANT
GLOVE SURG SIGNA 7.5 PF LTX (GLOVE) ×2 IMPLANT
GOWN STRL REUS W/ TWL LRG LVL3 (GOWN DISPOSABLE) ×1 IMPLANT
GOWN STRL REUS W/ TWL XL LVL3 (GOWN DISPOSABLE) ×1 IMPLANT
GOWN STRL REUS W/TWL LRG LVL3 (GOWN DISPOSABLE) ×1
GOWN STRL REUS W/TWL XL LVL3 (GOWN DISPOSABLE) ×1
KIT BASIN OR (CUSTOM PROCEDURE TRAY) ×2 IMPLANT
KIT TURNOVER KIT B (KITS) ×2 IMPLANT
MESH PARIETEX PROGRIP LEFT (Mesh General) ×2 IMPLANT
NEEDLE HYPO 25GX1X1/2 BEV (NEEDLE) ×2 IMPLANT
NS IRRIG 1000ML POUR BTL (IV SOLUTION) ×2 IMPLANT
PACK SURGICAL SETUP 50X90 (CUSTOM PROCEDURE TRAY) ×2 IMPLANT
PAD ARMBOARD 7.5X6 YLW CONV (MISCELLANEOUS) ×2 IMPLANT
PENCIL BUTTON HOLSTER BLD 10FT (ELECTRODE) IMPLANT
PENCIL SMOKE EVACUATOR (MISCELLANEOUS) ×2 IMPLANT
SPONGE LAP 18X18 X RAY DECT (DISPOSABLE) ×2 IMPLANT
SUT MON AB 4-0 PC3 18 (SUTURE) ×2 IMPLANT
SUT SILK 2 0 SH (SUTURE) ×2 IMPLANT
SUT VIC AB 2-0 CT1 27 (SUTURE) ×1
SUT VIC AB 2-0 CT1 TAPERPNT 27 (SUTURE) ×1 IMPLANT
SUT VIC AB 3-0 CT1 27 (SUTURE) ×1
SUT VIC AB 3-0 CT1 TAPERPNT 27 (SUTURE) ×1 IMPLANT
SYR CONTROL 10ML LL (SYRINGE) ×2 IMPLANT
TOWEL OR 17X24 6PK STRL BLUE (TOWEL DISPOSABLE) ×2 IMPLANT
TOWEL OR 17X26 10 PK STRL BLUE (TOWEL DISPOSABLE) ×2 IMPLANT

## 2018-10-07 NOTE — Anesthesia Postprocedure Evaluation (Signed)
Anesthesia Post Note  Patient: Ralph [name]  Procedure(s) Performed: OPEN LEFT INGUINAL HERNIA REPAIR ERAS PATHWAY (Left Groin) INSERTION OF MESH (Left Groin)     Patient location during evaluation: PACU Anesthesia Type: General Level of consciousness: awake and alert Pain management: pain level controlled Vital Signs Assessment: post-procedure vital signs reviewed and stable Respiratory status: spontaneous breathing, nonlabored ventilation and respiratory function stable Cardiovascular status: blood pressure returned to baseline and stable Postop Assessment: no apparent nausea or vomiting Anesthetic complications: no    Last Vitals:  Vitals:   10/07/18 0916 10/07/18 0931  BP: 134/71 140/76  Pulse: 64 (!) 59  Resp: 12 13  Temp:    SpO2: 100% 100%    Last Pain:  Vitals:   10/07/18 0945  TempSrc:   PainSc: [place]

## 2018-10-07 NOTE — Anesthesia Preprocedure Evaluation (Signed)
Anesthesia Evaluation  Patient identified by MRN, date of birth, ID band Patient awake    Reviewed: Allergy & Precautions, NPO status , Patient's Chart, lab work & pertinent test results  Airway Mallampati: I  TM Distance: >3 FB Neck ROM: Full    Dental  (+) Teeth Intact   Pulmonary neg pulmonary ROS,    Pulmonary exam normal breath sounds clear to auscultation       Cardiovascular hypertension, Pt. on medications Normal cardiovascular exam Rhythm:Regular Rate:Normal     Neuro/Psych negative neurological ROS  negative psych ROS   GI/Hepatic negative GI ROS, Neg liver ROS,   Endo/Other  negative endocrine ROS  Renal/GU negative Renal ROS     Musculoskeletal negative musculoskeletal ROS (+)   Abdominal   Peds  Hematology negative hematology ROS (+)   Anesthesia Other Findings   Reproductive/Obstetrics                             Anesthesia Physical  Anesthesia Plan  ASA: II  Anesthesia Plan: General   Post-op Pain Management:  Regional for Post-op pain   Induction: Intravenous  PONV Risk Score and Plan: 2 and Ondansetron and Midazolam  Airway Management Planned: Oral ETT and LMA  Additional Equipment:   Intra-op Plan:   Post-operative Plan: Extubation in OR  Informed Consent: I have reviewed the patients History and Physical, chart, labs and discussed the procedure including the risks, benefits and alternatives for the proposed anesthesia with the patient or authorized representative who has indicated his/her understanding and acceptance.   Dental advisory given  Plan Discussed with: CRNA  Anesthesia Plan Comments:         Anesthesia Quick Evaluation

## 2018-10-07 NOTE — Op Note (Signed)
OPEN LEFT INGUINAL HERNIA REPAIR ERAS PATHWAY, INSERTION OF MESH  Procedure Note  Ralph [name] 10/07/2018   Pre-op Diagnosis: recurrent left inguinal hernia     Post-op Diagnosis: same  Procedure(s): OPEN LEFT INGUINAL HERNIA REPAIR ERAS PATHWAY INSERTION OF MESH  Surgeon(s): Coralie Keens, MD  Anesthesia: General  Staff:  Circulator: Nicholos Johns, RN Relief Circulator: Rosanne Sack, RN Relief Scrub: Rosanne Sack, RN Scrub Person: McEneany, Amy L, CST  Estimated Blood Loss: Minimal               Findings:  He was found to have a recurrent indirect left inguinal hernia  Procedure: The patient was brought to the operating room and identified as the correct patient.  He was placed supine on the operating table and general anesthesia was induced.  His left lower quadrant was then prepped and draped in the usual sterile fashion.  I anesthetized the skin with Marcaine and made a longitudinal incision in the left inguinal area with a scalpel.  I dissected down through Scarpa's fascia with the electrocautery.  I then identified the external oblique fascia and opened toward the internal and external rings.  The patient was found to have a small indirect hernia sac which would come through the internal ring underneath the previously placed mesh.  I was able to reduce the sac which had no bowel in it.  I then closed the internal ring with a figure-of-eight 2-0 silk suture.  Next a piece of rolling pro-grip mesh was brought to the field.  I placed it into the pubic tubercle and the brought around the cord structures.  I then placed a 2-0 Vicryl suture at the pubic tubercle as well as laterally to secure the mesh in place.  I then closed the external oblique fascia over the top of the mesh with a running 2-0 Vicryl suture.  I anesthetized the fascia further with Marcaine.  I then closed Scarpa's fascia with interrupted 3-0 Vicryl sutures and closed the skin with a running 4-0  Monocryl.  Dermabond was then applied.  The patient tolerated the procedure well.  All the counts were correct at the end of the procedure.  The patient was then extubated in the operating room and taken in a stable condition to the recovery room.          [name]   Date: 10/07/2018  Time: 9:10 AM

## 2018-10-07 NOTE — Anesthesia Procedure Notes (Signed)
Anesthesia Regional Block: TAP block   Pre-Anesthetic Checklist: ,, timeout performed, Correct Patient, Correct Site, Correct Laterality, Correct Procedure, Correct Position, site marked, Risks and benefits discussed,  Surgical consent,  Pre-op evaluation,  At surgeon's request and post-op pain management  Laterality: Left  Prep: chloraprep       Needles:  Injection technique: Single-shot  Needle Type: Stimiplex     Needle Length: 9cm  Needle Gauge: 21     Additional Needles:   Procedures:,,,, ultrasound used (permanent image in chart),,,,  Narrative:  Start time: 10/07/2018 8:09 AM End time: 10/07/2018 8:14 AM Injection made incrementally with aspirations every 5 mL.  Performed by: Personally

## 2018-10-07 NOTE — Anesthesia Procedure Notes (Signed)
Procedure Name: LMA Insertion Date/Time: 10/07/2018 8:37 AM Performed by: Myna Bright, CRNA Pre-anesthesia Checklist: Patient identified, Emergency Drugs available, Suction available and Patient being monitored Patient Re-evaluated:Patient Re-evaluated prior to induction Oxygen Delivery Method: Circle System Utilized Preoxygenation: Pre-oxygenation with 100% oxygen Induction Type: IV induction LMA: LMA inserted LMA Size: 5.0 Number of attempts: 1 Placement Confirmation: positive ETCO2 Tube secured with: Tape Dental Injury: Teeth and Oropharynx as per pre-operative assessment

## 2018-10-07 NOTE — Discharge Instructions (Signed)
[facility] ° °UMBILICAL OR INGUINAL HERNIA REPAIR: POST OP INSTRUCTIONS ° °Always review your discharge instruction sheet given to you by the facility where your surgery was performed. °IF YOU HAVE DISABILITY OR FAMILY LEAVE FORMS, YOU MUST BRING THEM TO THE OFFICE FOR PROCESSING.   °DO NOT GIVE THEM TO YOUR DOCTOR. ° °1. A  prescription for pain medication may be given to you upon discharge.  Take your pain medication as prescribed, if needed.  If narcotic pain medicine is not needed, then you may take acetaminophen (Tylenol) or ibuprofen (Advil) as needed. °2. Take your usually prescribed medications unless otherwise directed. °If you need a refill on your pain medication, please contact your pharmacy.  They will contact our office to request authorization. Prescriptions will not be filled after 5 pm or on week-ends. °3. You should follow a light diet the first 24 hours after arrival home, such as soup and crackers, etc.  Be sure to include lots of fluids daily.  Resume your normal diet the day after surgery. °4.Most patients will experience some swelling and bruising around the umbilicus or in the groin and scrotum.  Ice packs and reclining will help.  Swelling and bruising can take several days to resolve.  °6. It is common to experience some constipation if taking pain medication after surgery.  Increasing fluid intake and taking a stool softener (such as Colace) will usually help or prevent this problem from occurring.  A mild laxative (Milk of Magnesia or Miralax) should be taken according to package directions if there are no bowel movements after 48 hours. °7. Unless discharge instructions indicate otherwise, you may remove your bandages 24-48 hours after surgery, and you may shower at that time.  You may have steri-strips (small skin tapes) in place directly over the incision.  These strips should be left on the skin for 7-10 days.  If your surgeon used skin glue on the  incision, you may shower in 24 hours.  The glue will flake off over the next 2-3 weeks.  Any sutures or staples will be removed at the office during your follow-up visit. °8. ACTIVITIES:  You may resume regular (light) daily activities beginning the next day--such as daily self-care, walking, climbing stairs--gradually increasing activities as tolerated.  You may have sexual intercourse when it is comfortable.  Refrain from any heavy lifting or straining until approved by your doctor. ° °a.You may drive when you are no longer taking prescription pain medication, you can comfortably wear a seatbelt, and you can safely maneuver your car and apply brakes. °b.RETURN TO WORK:   °_____________________________________________ ° °9.You should see your doctor in the office for a follow-up appointment approximately 2-3 weeks after your surgery.  Make sure that you call for this appointment within a day or two after you arrive home to insure a convenient appointment time. °10.OTHER INSTRUCTIONS: __OK TO SHOWER STARTING TOMORROW °ICE PACK, TYLENOL, IBUPROFEN ALSO FOR PAIN °NO LIFTING MORE THAN 15 TO 20 POUNDS FOR 4 WEEKS_______________________ °   _____________________________________ ° °WHEN TO CALL YOUR DOCTOR: °1. Fever over 101.0 °2. Inability to urinate °3. Nausea and/or vomiting °4. Extreme swelling or bruising °5. Continued bleeding from incision. °6. Increased pain, redness, or drainage from the incision ° °The clinic staff is available to answer your questions during regular business hours.  Please don’t hesitate to call and ask to speak to one of the nurses for clinical concerns.  If you have a medical emergency, go to the   the nearest emergency room or call 911.  A surgeon from [facility] is always on call at the hospital   [address], [address], [place], [place]  [address] ?  [address] [address], [place], [place]   [address] [phone] ? [phone] ? FAX [phone] Web site:  [email or web address] 

## 2018-10-07 NOTE — Transfer of Care (Signed)
Immediate Anesthesia Transfer of Care Note  Patient: Ralph [name]  Procedure(s) Performed: OPEN LEFT INGUINAL HERNIA REPAIR ERAS PATHWAY (Left Groin) INSERTION OF MESH (Left Groin)  Patient Location: PACU  Anesthesia Type:GA combined with regional for post-op pain  Level of Consciousness: awake, alert , oriented and patient cooperative  Airway & Oxygen Therapy: Patient Spontanous Breathing and Patient connected to nasal cannula oxygen  Post-op Assessment: Report given to RN, Post -op Vital signs reviewed and stable and Patient moving all extremities  Post vital signs: Reviewed and stable  Last Vitals:  Vitals Value Taken Time  BP    Temp    Pulse 70 10/07/2018  9:14 AM  Resp    SpO2 100 % 10/07/2018  9:14 AM  Vitals shown include unvalidated device data.  Last Pain:  Vitals:   10/07/18 0728  TempSrc: Oral  PainSc: 0-No pain         Complications: No apparent anesthesia complications

## 2018-10-08 ENCOUNTER — Encounter (HOSPITAL_COMMUNITY): Payer: Self-pay | Admitting: Surgery

## 2018-10-21 ENCOUNTER — Other Ambulatory Visit: Payer: Self-pay | Admitting: Surgery

## 2018-11-22 ENCOUNTER — Other Ambulatory Visit: Payer: Self-pay | Admitting: Internal Medicine

## 2018-12-19 ENCOUNTER — Other Ambulatory Visit: Payer: Self-pay | Admitting: Internal Medicine

## 2019-02-14 ENCOUNTER — Ambulatory Visit (INDEPENDENT_AMBULATORY_CARE_PROVIDER_SITE_OTHER): Payer: BLUE CROSS/BLUE SHIELD | Admitting: Internal Medicine

## 2019-02-14 ENCOUNTER — Encounter: Payer: Self-pay | Admitting: Internal Medicine

## 2019-02-14 VITALS — BP 132/84 | HR 54 | Temp 98.1°F | Resp 16 | Ht 69.0 in | Wt 189.1 lb

## 2019-02-14 DIAGNOSIS — F4321 Adjustment disorder with depressed mood: Secondary | ICD-10-CM | POA: Diagnosis not present

## 2019-02-14 DIAGNOSIS — I10 Essential (primary) hypertension: Secondary | ICD-10-CM | POA: Diagnosis not present

## 2019-02-14 LAB — BASIC METABOLIC PANEL
BUN: 18 mg/dL (ref 6–23)
CALCIUM: 8.9 mg/dL (ref 8.4–10.5)
CHLORIDE: 104 meq/L (ref 96–112)
CO2: 29 meq/L (ref 19–32)
CREATININE: 0.9 mg/dL (ref 0.40–1.50)
GFR: 84.22 mL/min (ref >60.00)
Glucose, Bld: 115 mg/dL — ABNORMAL HIGH (ref 70–99)
Potassium: 4.3 mEq/L (ref 3.5–5.1)
Sodium: 139 mEq/L (ref 135–145)

## 2019-02-14 MED ORDER — LOSARTAN POTASSIUM 100 MG PO TABS: 100.0000 mg | ORAL_TABLET | Freq: Every day | ORAL | 1 refills | Status: DC

## 2019-02-14 NOTE — Assessment & Plan Note (Signed)
HTN: Previously well controlled with losartan 50 mg, lately BP has been elevated (see next) he self increased losartan to 100 mg daily yet his BP range from 120-160. Plan: Check a BMP, stay on losartan 100 mg daily, restart metoprolol XL 50 mg: Half tablet daily.  Continue monitoring BPs. Grieving: Loss a son 12/02/2018, patient is counseled, he seems to be grieving in a normal way, I counseled him the best I could, we agreed he will reach for help if needed, knows to call this office or get a counselor prn. RTC 05/2019 CPX.

## 2019-02-14 NOTE — Progress Notes (Signed)
Pre visit review using our clinic review tool, if applicable. No additional management support is needed unless otherwise documented below in the visit note. 

## 2019-02-14 NOTE — Patient Instructions (Addendum)
GO TO THE LAB : Get the blood work     GO TO THE FRONT DESK Schedule your next appointment   For a physical by 05/2019    Check the  blood pressure 2 or 3 times a week   Be sure your blood pressure is between 110/65 and  135/85. If it is consistently higher or lower, let me know   HOW TO TAKE YOUR BLOOD PRESSURE:   Rest 5 minutes before taking your blood pressure.   Don't smoke or drink caffeinated beverages for at least 30 minutes before.   Take your blood pressure before (not after) you eat.   Sit comfortably with your back supported and both feet on the floor (don't cross your legs).   Elevate your arm to heart level on a table or a desk.   Use the proper sized cuff. It should fit smoothly and snugly around your bare upper arm. There should be enough room to slip a fingertip under the cuff. The bottom edge of the cuff should be 1 inch above the crease of the elbow.   Ideally, take 3 measurements at one sitting and record the average.

## 2019-02-14 NOTE — Progress Notes (Signed)
Subjective:    Patient ID: Ralph [name], male    DOB: [birth year], 68 y.o.   MRN: [id number]  DOS:  02/14/2019 Type of visit - description: concerned about BP In the last few weeks, BP has been elevated on and off, this morning was in the 160s, He self increased losartan from 50 mg to 100 mg. Further questions, he has been very stressed, lost a son December 19.  Obviously deeply affected by his loss.   Review of Systems He is trying to remain active, eating healthy  Past Medical History:  Diagnosis Date  . Hyperlipidemia 2007  . Hypertension 2007  . Inguinal hernia bilateral, non-recurrent   . Normal cardiac stress test 2008   per pt    Past Surgical History:  Procedure Laterality Date  . INGUINAL HERNIA REPAIR Bilateral 05/31/2018   Procedure: LAPAROSCOPIC BILATERAL INGUINAL HERNIA REPAIR ERAS PATHWAY;  Surgeon: Coralie Keens, MD;  Location: [place];  Service: General;  Laterality: Bilateral;  . INGUINAL HERNIA REPAIR Left 10/07/2018   Procedure: OPEN LEFT INGUINAL HERNIA REPAIR ERAS PATHWAY;  Surgeon: Coralie Keens, MD;  Location: [place];  Service: General;  Laterality: Left;  TAP BLOCK  . INSERTION OF MESH Bilateral 05/31/2018   Procedure: INSERTION OF MESH;  Surgeon: Coralie Keens, MD;  Location: [place];  Service: General;  Laterality: Bilateral;  . INSERTION OF MESH Left 10/07/2018   Procedure: INSERTION OF MESH;  Surgeon: Coralie Keens, MD;  Location: [place];  Service: General;  Laterality: Left;    Social History   Socioeconomic History  . Marital status: Divorced    Spouse name: Not on file  . Number of children: 1  . Years of education: Not on file  . Highest education level: Not on file  Occupational History  . Occupation: Press photographer, hearing aid  Social Needs  . Financial resource strain: Not on file  . Food insecurity:    Worry: Not on file    Inability: Not on file  . Transportation needs:    Medical: Not on  file    Non-medical: Not on file  Tobacco Use  . Smoking status: Never Smoker  . Smokeless tobacco: Never Used  Substance and Sexual Activity  . Alcohol use: Yes    Comment: socially  . Drug use: No  . Sexual activity: Yes  Lifestyle  . Physical activity:    Days per week: Not on file    Minutes per session: Not on file  . Stress: Not on file  Relationships  . Social connections:    Talks on phone: Not on file    Gets together: Not on file    Attends religious service: Not on file    Active member of club or organization: Not on file    Attends meetings of clubs or organizations: Not on file    Relationship status: Not on file  . Intimate partner violence:    Fear of current or ex partner: Not on file    Emotionally abused: Not on file    Physically abused: Not on file    Forced sexual activity: Not on file  Other Topics Concern  . Not on file  Social History Narrative   Original from [place] w/  girlfriend part time                Allergies as of 02/14/2019      Reactions   Penicillins    UNSPECIFIED  CHILDHOOD REACTION  Has patient had a PCN reaction causing immediate rash, facial/tongue/throat swelling, SOB or lightheadedness with hypotension: Unknown Has patient had a PCN reaction causing severe rash involving mucus membranes or skin necrosis: Unknown Has patient had a PCN reaction that required hospitalization: No Has patient had a PCN reaction occurring within the last 10 years: No If all of the above answers are "NO", then may proceed with Cephalosporin use.      Medication List       Accurate as of February 14, 2019  9:25 PM. Always use your most recent med list.        atorvastatin 40 MG tablet Commonly known as:  LIPITOR Take 0.5 tablets (20 mg total) by mouth daily.   fluticasone 50 MCG/ACT nasal spray Commonly known as:  FLONASE Place 2 sprays into both nostrils daily as needed for allergies or rhinitis.   losartan 100 MG tablet Commonly  known as:  COZAAR Take 1 tablet (100 mg total) by mouth daily.   metoprolol succinate 50 MG 24 hr tablet Commonly known as:  TOPROL-XL Take 1 tablet (50 mg total) by mouth daily. Take 1/2 tablet by mouth daily           Objective:   Physical Exam BP 132/84 (BP Location: Left Arm, Patient Position: Sitting, Cuff Size: Small)   Pulse (!) 54   Temp 98.1 F (36.7 C) (Oral)   Resp 16   Ht 5\' 9"  (1.753 m)   Wt 189 lb 2 oz (85.8 kg)   SpO2 95%   BMI 27.93 kg/m  General:   Well developed, NAD, BMI noted. HEENT:  Normocephalic . Face symmetric, atraumatic Lungs:  CTA B Normal respiratory effort, no intercostal retractions, no accessory muscle use. Heart: RRR,  no murmur.  No pretibial edema bilaterally  Skin: Not pale. Not jaundice Neurologic:  alert & oriented X3.  Speech normal, gait appropriate for age and unassisted Psych--  Cognition and judgment appear intact.  Cooperative with normal attention span and concentration.  Behavior appropriate. No anxious or depressed appearing.      Assessment     Assessment HTN Hyperlipidemia Tinnitus (used to take xanas very rarely) E.D. Normal stress test 2008 per patient + FH CAD: Aspirin daily  Plan:  HTN: Previously well controlled with losartan 50 mg, lately BP has been elevated (see next) he self increased losartan to 100 mg daily yet his BP range from 120-160. Plan: Check a BMP, stay on losartan 100 mg daily, restart metoprolol XL 50 mg: Half tablet daily.  Continue monitoring BPs. Grieving: Loss a son 12/02/2018, patient is counseled, he seems to be grieving in a normal way, I counseled him the best I could, we agreed he will reach for help if needed, knows to call this office or get a counselor prn. RTC 05/2019 CPX.

## 2019-03-17 ENCOUNTER — Telehealth: Payer: Self-pay | Admitting: Internal Medicine

## 2019-03-17 NOTE — Telephone Encounter (Signed)
Copied from [place] [id number]. Topic: Quick Communication - Rx Refill/Question >> Mar 17, 2019  2:53 PM Sheran Luz wrote: Medication:losartan (COZAAR) 100 MG tablet   Patient was advised by pharmacy that PCP or CMA needs to contact them for clarification on this medication. They have that he only takes 1/2 a tablet on file and so patient was only able to pick up 15 tablets.   Preferred Pharmacy (with phone number or street name):[id number] - [place], [address] [place] [phone] (Phone) [phone] (Fax)

## 2019-03-17 NOTE — Telephone Encounter (Signed)
Please advise. I can call pharmacy.

## 2019-03-18 MED ORDER — LOSARTAN POTASSIUM 100 MG PO TABS: 100.0000 mg | ORAL_TABLET | Freq: Every day | ORAL | 1 refills | Status: DC

## 2019-03-18 NOTE — Telephone Encounter (Signed)
Per our records Pt takes losartan 100mg  one tablet daily. [facility] to [facility].

## 2019-05-11 ENCOUNTER — Telehealth: Payer: Self-pay | Admitting: Internal Medicine

## 2019-05-11 NOTE — Telephone Encounter (Signed)
Please advise on metoprolol dosage- 2 different sigs on med list.

## 2019-05-11 NOTE — Telephone Encounter (Signed)
He is supposed to be taking half tablet daily of metoprolol.  Prescription sent.

## 2019-05-19 ENCOUNTER — Telehealth: Payer: Self-pay

## 2019-05-19 NOTE — Telephone Encounter (Signed)
Copied from [place] [id number]. Topic: General - Inquiry >> May 18, 2019 11:15 AM Ralph [name], NT wrote: Reason for CRM: Patient is asking that the prescription be changed as he is taking 1 pill per day, not half for the metoprolol succinate (TOPROL-XL) 50 MG 24 hr tablet. Please advise and call back is [phone].

## 2019-05-20 MED ORDER — METOPROLOL SUCCINATE ER 50 MG PO TB24: 50.0000 mg | ORAL_TABLET | Freq: Every day | ORAL | 1 refills | Status: DC

## 2019-05-20 NOTE — Telephone Encounter (Signed)
Rx sent. Mychart message sent to Pt.  

## 2019-05-20 NOTE — Addendum Note (Signed)
Addended byDamita Dunnings D on: 05/20/2019 09:37 AM   Modules accepted: Orders

## 2019-05-20 NOTE — Telephone Encounter (Signed)
Please advise 

## 2019-05-20 NOTE — Telephone Encounter (Signed)
Okay, he probably self increase metoprolol XL 50 mg to 1 tablet a day, okay to send a new prescription. Also, send the patient a message, ask him to monitor BPs and heart rate. If the heart rate is less than 55 or the BP is not well controlled he needs to let us know

## 2019-09-29 ENCOUNTER — Other Ambulatory Visit: Payer: Self-pay | Admitting: Internal Medicine

## 2019-10-19 ENCOUNTER — Other Ambulatory Visit: Payer: Self-pay | Admitting: Internal Medicine

## 2019-10-22 ENCOUNTER — Other Ambulatory Visit: Payer: Self-pay | Admitting: Internal Medicine

## 2019-11-03 ENCOUNTER — Other Ambulatory Visit: Payer: Self-pay

## 2019-11-04 ENCOUNTER — Encounter: Payer: Self-pay | Admitting: Internal Medicine

## 2019-11-04 ENCOUNTER — Ambulatory Visit (INDEPENDENT_AMBULATORY_CARE_PROVIDER_SITE_OTHER): Payer: Medicare Other | Admitting: Internal Medicine

## 2019-11-04 ENCOUNTER — Other Ambulatory Visit: Payer: Self-pay

## 2019-11-04 VITALS — BP 147/76 | HR 56 | Temp 97.1°F | Resp 16 | Ht 69.0 in | Wt 186.1 lb

## 2019-11-04 DIAGNOSIS — Z0000 Encounter for general adult medical examination without abnormal findings: Secondary | ICD-10-CM

## 2019-11-04 DIAGNOSIS — Z23 Encounter for immunization: Secondary | ICD-10-CM

## 2019-11-04 DIAGNOSIS — E875 Hyperkalemia: Secondary | ICD-10-CM | POA: Diagnosis not present

## 2019-11-04 DIAGNOSIS — I10 Essential (primary) hypertension: Secondary | ICD-10-CM | POA: Diagnosis not present

## 2019-11-04 MED ORDER — SHINGRIX 50 MCG/0.5ML IM SUSR: 0.5000 mL | Freq: Once | INTRAMUSCULAR | 1 refills | Status: AC

## 2019-11-04 NOTE — Progress Notes (Signed)
Subjective:    Patient ID: Ralph [name], male    DOB: 17-Jul-1951, 68 y.o.   MRN: [id number]  DOS:  11/04/2019 Type of visit - description: CPX No major concerns. He recently retired, he is very active enjoying his retirement.  Wt Readings from Last 3 Encounters:  11/04/19 186 lb 2 oz (84.4 kg)  02/14/19 189 lb 2 oz (85.8 kg)  10/04/18 183 lb 14.4 oz (83.4 kg)    Review of Systems No new symptoms or concerns A 14 point review of systems is negative   Past Medical History:  Diagnosis Date  . Hyperlipidemia 2007  . Hypertension 2007  . Inguinal hernia bilateral, non-recurrent   . Normal cardiac stress test 2008   per pt    Past Surgical History:  Procedure Laterality Date  . INGUINAL HERNIA REPAIR Bilateral 05/31/2018   Procedure: LAPAROSCOPIC BILATERAL INGUINAL HERNIA REPAIR ERAS PATHWAY;  Surgeon: Coralie Keens, MD;  Location: [place];  Service: General;  Laterality: Bilateral;  . INGUINAL HERNIA REPAIR Left 10/07/2018   Procedure: OPEN LEFT INGUINAL HERNIA REPAIR ERAS PATHWAY;  Surgeon: Coralie Keens, MD;  Location: [facility];  Service: General;  Laterality: Left;  TAP BLOCK  . INSERTION OF MESH Bilateral 05/31/2018   Procedure: INSERTION OF MESH;  Surgeon: Coralie Keens, MD;  Location: [place];  Service: General;  Laterality: Bilateral;  . INSERTION OF MESH Left 10/07/2018   Procedure: INSERTION OF MESH;  Surgeon: Coralie Keens, MD;  Location: [place];  Service: General;  Laterality: Left;    Social History   Socioeconomic History  . Marital status: Divorced    Spouse name: Not on file  . Number of children: 1  . Years of education: Not on file  . Highest education level: Not on file  Occupational History  . 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 Financial resource strain: Not on file  . Food insecurity    Worry: Not on file    Inability: Not on file  . Transportation needs    Medical: Not  on file    Non-medical: Not on file  Tobacco Use  . Smoking status: Never Smoker  . Smokeless tobacco: Never Used  Substance and Sexual Activity  . Alcohol use: Yes    Comment: socially  . Drug use: No  . Sexual activity: Yes  Lifestyle  . Physical activity    Days per week: Not on file    Minutes per session: Not on file  . Stress: Not on file  Relationships  . Social Herbalist on phone: Not on file    Gets together: Not on file    Attends religious service: Not on file    Active member of club or organization: Not on file    Attends meetings of clubs or organizations: Not on file    Relationship status: Not on file  . Intimate partner violence    Fear of current or ex partner: Not on file    Emotionally abused: Not on file    Physically abused: Not on file    Forced sexual activity: Not on file  Other Topics Concern  . Not on file  Social History Narrative   Original from [place] w/  girlfriend part time               Family History  Problem Relation Age of Onset  . Breast cancer Mother   .  Bone cancer Mother   . Coronary artery disease Father        w/ first MI age 48, GF MI in the early 24s  . Hypertension Sister   . Prostate cancer Neg Hx   . Colon cancer Neg Hx   . Diabetes Neg Hx      Allergies as of 11/04/2019      Reactions   Penicillins    UNSPECIFIED CHILDHOOD REACTION  Has patient had a PCN reaction causing immediate rash, facial/tongue/throat swelling, SOB or lightheadedness with hypotension: Unknown Has patient had a PCN reaction causing severe rash involving mucus membranes or skin necrosis: Unknown Has patient had a PCN reaction that required hospitalization: No Has patient had a PCN reaction occurring within the last 10 years: No If all of the above answers are "NO", then may proceed with Cephalosporin use.      Medication List       Accurate as of November 04, 2019 11:59 PM. If you have any questions, ask your nurse or  doctor.        atorvastatin 40 MG tablet Commonly known as: LIPITOR Take 0.5 tablets (20 mg total) by mouth daily.   fluticasone 50 MCG/ACT nasal spray Commonly known as: FLONASE Place 2 sprays into both nostrils daily as needed for allergies or rhinitis.   losartan 100 MG tablet Commonly known as: COZAAR Take 1 tablet (100 mg total) by mouth daily.   metoprolol succinate 50 MG 24 hr tablet Commonly known as: TOPROL-XL Take 1 tablet (50 mg total) by mouth daily.   Shingrix injection Generic drug: Zoster Vaccine Adjuvanted Inject 0.5 mLs into the muscle once for 1 dose. Started by: Kathlene November, MD           Objective:   Physical Exam BP (!) 147/76 (BP Location: Left Arm, Patient Position: Sitting, Cuff Size: Small)   Pulse (!) 56   Temp (!) 97.1 F (36.2 C) (Temporal)   Resp 16   Ht 5\' 9"  (1.753 m)   Wt 186 lb 2 oz (84.4 kg)   SpO2 100%   BMI 27.49 kg/m  General: Well developed, NAD, BMI noted Neck: No  thyromegaly  HEENT:  Normocephalic . Face symmetric, atraumatic Lungs:  CTA B Normal respiratory effort, no intercostal retractions, no accessory muscle use. Heart: RRR,  no murmur.  No pretibial edema bilaterally  Abdomen:  Not distended, soft, non-tender. No rebound or rigidity.   Skin: Exposed areas without rash. Not pale. Not jaundice Neurologic:  alert & oriented X3.  Speech normal, gait appropriate for age and unassisted Strength symmetric and appropriate for age.  Psych: Cognition and judgment appear intact.  Cooperative with normal attention span and concentration.  Behavior appropriate. No anxious or depressed appearing.     Assessment     Assessment HTN Hyperlipidemia Tinnitus (used to take xanas very rarely) E.D. Normal stress test 2008 per patient + FH CAD: Aspirin daily  Plan: Here for CPX HTN: Currently on losartan 100 mg and metoprolol, pulses 56.  Ambulatory BPs are very good, early in the morning sometimes is in the  150s. Checking labs, will change the timing of metoprolol from morning to afternoon and see if that improves his morning BPs. Hyperlipidemia: On Lipitor, checking labs RTC 6 months    This visit occurred during the SARS-CoV-2 public health emergency.  Safety protocols were in place, including screening questions prior to the visit, additional usage of staff PPE, and extensive cleaning of exam room while observing  appropriate contact time as indicated for disinfecting solutions.

## 2019-11-04 NOTE — Assessment & Plan Note (Addendum)
--  Td 2012 --Prevnar today - zostavax: 2012 - shingrix Rx printed --Prostate ca screening:   DRE 04/2018: wnl, check a  PSA   --Cscope 01-2006, saw [facility] 04/2018, Cscope June 06/2018, benign bx, 10 years per GI letter to the patient --Diet-exercise-doing great with lifestyle, has lost wt.praised  --Recent labs reviewed, due for FLP, A1c, TSH, PSA

## 2019-11-04 NOTE — Patient Instructions (Addendum)
Please schedule Medicare Wellness with Ralph [name].   GO TO THE LAB : Get the blood work     GO TO THE FRONT DESK Schedule your next appointment   For a check up in 6 months    Check the  blood pressure daily BP GOAL is between 110/65 and  135/85. If it is consistently higher or lower, let me know   HOW TO TAKE YOUR BLOOD PRESSURE:   Rest 5 minutes before taking your blood pressure.   Don't smoke or drink caffeinated beverages for at least 30 minutes before.   Take your blood pressure before (not after) you eat.   Sit comfortably with your back supported and both feet on the floor (don't cross your legs).   Elevate your arm to heart level on a table or a desk.   Use the proper sized cuff. It should fit smoothly and snugly around your bare upper arm. There should be enough room to slip a fingertip under the cuff. The bottom edge of the cuff should be 1 inch above the crease of the elbow.   Ideally, take 3 measurements at one sitting and record the average.

## 2019-11-04 NOTE — Progress Notes (Signed)
Pre visit review using our clinic review tool, if applicable. No additional management support is needed unless otherwise documented below in the visit note. 

## 2019-11-05 LAB — LIPID PANEL
Cholesterol: 161 mg/dL (ref <200)
HDL: 59 mg/dL (ref >=40)
LDL Cholesterol (Calc): 86 mg/dL (calc)
Non-HDL Cholesterol (Calc): 102 mg/dL (calc) (ref <130)
Total CHOL/HDL Ratio: 2.7 (calc) (ref <5.0)
Triglycerides: 71 mg/dL (ref <150)

## 2019-11-05 LAB — CBC WITH DIFFERENTIAL/PLATELET
Absolute Monocytes: 604 cells/uL (ref 200–950)
Basophils Absolute: 31 cells/uL (ref 0–200)
Basophils Relative: 0.5 %
Eosinophils Absolute: 159 cells/uL (ref 15–500)
Eosinophils Relative: 2.6 %
HCT: 42 % (ref 38.5–50.0)
Hemoglobin: 14.3 g/dL (ref 13.2–17.1)
Lymphs Abs: 1922 cells/uL (ref 850–3900)
MCH: 31.1 pg (ref 27.0–33.0)
MCHC: 34 g/dL (ref 32.0–36.0)
MCV: 91.3 fL (ref 80.0–100.0)
MPV: 10.1 fL (ref 7.5–12.5)
Monocytes Relative: 9.9 %
Neutro Abs: 3386 cells/uL (ref 1500–7800)
Neutrophils Relative %: 55.5 %
Platelets: 215 10*3/uL (ref 140–400)
RBC: 4.6 10*6/uL (ref 4.20–5.80)
RDW: 12 % (ref 11.0–15.0)
Total Lymphocyte: 31.5 %
WBC: 6.1 10*3/uL (ref 3.8–10.8)

## 2019-11-05 LAB — COMPREHENSIVE METABOLIC PANEL
AG Ratio: 1.5 (calc) (ref 1.0–2.5)
ALT: 14 U/L (ref 9–46)
AST: 19 U/L (ref 10–35)
Albumin: 4.2 g/dL (ref 3.6–5.1)
Alkaline phosphatase (APISO): 53 U/L (ref 35–144)
BUN: 19 mg/dL (ref 7–25)
CO2: 25 mmol/L (ref 20–32)
Calcium: 9.3 mg/dL (ref 8.6–10.3)
Chloride: 105 mmol/L (ref 98–110)
Creat: 0.94 mg/dL (ref 0.70–1.25)
Globulin: 2.8 g/dL (calc) (ref 1.9–3.7)
Glucose, Bld: 96 mg/dL (ref 65–99)
Potassium: 5.5 mmol/L — ABNORMAL HIGH (ref 3.5–5.3)
Sodium: 140 mmol/L (ref 135–146)
Total Bilirubin: 1 mg/dL (ref 0.2–1.2)
Total Protein: 7 g/dL (ref 6.1–8.1)

## 2019-11-05 LAB — PSA: PSA: 0.5 ng/mL (ref <=4.0)

## 2019-11-05 NOTE — Assessment & Plan Note (Signed)
Here for CPX HTN: Currently on losartan 100 mg and metoprolol, pulses 56.  Ambulatory BPs are very good, early in the morning sometimes is in the 150s. Checking labs, will change the timing of metoprolol from morning to afternoon and see if that improves his morning BPs. Hyperlipidemia: On Lipitor, checking labs RTC 6 months

## 2019-11-09 ENCOUNTER — Other Ambulatory Visit (INDEPENDENT_AMBULATORY_CARE_PROVIDER_SITE_OTHER): Payer: Medicare Other

## 2019-11-09 DIAGNOSIS — E875 Hyperkalemia: Secondary | ICD-10-CM

## 2019-11-09 LAB — BASIC METABOLIC PANEL
BUN: 17 mg/dL (ref 6–23)
CO2: 28 mEq/L (ref 19–32)
Calcium: 9.2 mg/dL (ref 8.4–10.5)
Chloride: 101 mEq/L (ref 96–112)
Creatinine, Ser: 0.91 mg/dL (ref 0.40–1.50)
GFR: 82.97 mL/min (ref >60.00)
Glucose, Bld: 107 mg/dL — ABNORMAL HIGH (ref 70–99)
Potassium: 4.4 mEq/L (ref 3.5–5.1)
Sodium: 137 mEq/L (ref 135–145)

## 2019-11-09 NOTE — Addendum Note (Signed)
Addended byDamita Dunnings D on: 11/09/2019 08:18 AM   Modules accepted: Orders

## 2019-11-10 ENCOUNTER — Other Ambulatory Visit: Payer: Self-pay | Admitting: Internal Medicine

## 2019-11-14 MED ORDER — METOPROLOL SUCCINATE ER 50 MG PO TB24: 50.0000 mg | ORAL_TABLET | Freq: Every day | ORAL | 1 refills | Status: DC

## 2019-12-23 ENCOUNTER — Encounter: Payer: Self-pay | Admitting: Internal Medicine

## 2020-02-06 ENCOUNTER — Telehealth: Payer: Self-pay | Admitting: Internal Medicine

## 2020-02-06 MED ORDER — ATORVASTATIN CALCIUM 40 MG PO TABS: 20.0000 mg | ORAL_TABLET | Freq: Every day | ORAL | 1 refills | Status: DC

## 2020-02-06 MED ORDER — LOSARTAN POTASSIUM 100 MG PO TABS: 100.0000 mg | ORAL_TABLET | Freq: Every day | ORAL | 1 refills | Status: DC

## 2020-02-06 MED ORDER — METOPROLOL SUCCINATE ER 50 MG PO TB24: 50.0000 mg | ORAL_TABLET | Freq: Every day | ORAL | 1 refills | Status: DC

## 2020-02-06 NOTE — Telephone Encounter (Signed)
Patient is changing Pharmacy to [facility]), mail order.   Fax: [phone]  Telephone:[phone]

## 2020-02-06 NOTE — Telephone Encounter (Signed)
Pharmacy updated.

## 2020-02-06 NOTE — Addendum Note (Signed)
Addended byDamita Dunnings D on: 02/06/2020 12:38 PM   Modules accepted: Orders

## 2020-04-16 ENCOUNTER — Other Ambulatory Visit: Payer: Self-pay

## 2020-04-16 MED ORDER — FLUTICASONE PROPIONATE 50 MCG/ACT NA SUSP: 2.0000 | Freq: Every day | NASAL | 3 refills | Status: DC | PRN

## 2020-05-28 ENCOUNTER — Other Ambulatory Visit: Payer: Self-pay | Admitting: Internal Medicine

## 2020-06-24 ENCOUNTER — Other Ambulatory Visit: Payer: Self-pay | Admitting: Internal Medicine

## 2020-07-29 ENCOUNTER — Other Ambulatory Visit: Payer: Self-pay | Admitting: Internal Medicine

## 2020-08-17 ENCOUNTER — Other Ambulatory Visit: Payer: Self-pay | Admitting: Internal Medicine

## 2020-09-12 DIAGNOSIS — Z20822 Contact with and (suspected) exposure to covid-19: Secondary | ICD-10-CM | POA: Diagnosis not present

## 2020-09-12 DIAGNOSIS — Z9189 Other specified personal risk factors, not elsewhere classified: Secondary | ICD-10-CM | POA: Diagnosis not present

## 2020-10-18 ENCOUNTER — Other Ambulatory Visit: Payer: Self-pay | Admitting: Internal Medicine

## 2020-11-10 ENCOUNTER — Other Ambulatory Visit: Payer: Self-pay | Admitting: Internal Medicine

## 2020-12-14 ENCOUNTER — Encounter: Payer: Self-pay | Admitting: Internal Medicine

## 2020-12-14 DIAGNOSIS — E785 Hyperlipidemia, unspecified: Secondary | ICD-10-CM

## 2020-12-14 DIAGNOSIS — Z0000 Encounter for general adult medical examination without abnormal findings: Secondary | ICD-10-CM

## 2020-12-14 DIAGNOSIS — I10 Essential (primary) hypertension: Secondary | ICD-10-CM

## 2020-12-25 ENCOUNTER — Other Ambulatory Visit: Payer: Medicare Other

## 2020-12-25 DIAGNOSIS — Z20822 Contact with and (suspected) exposure to covid-19: Secondary | ICD-10-CM | POA: Diagnosis not present

## 2020-12-27 LAB — SARS-COV-2, NAA 2 DAY TAT

## 2020-12-27 LAB — NOVEL CORONAVIRUS, NAA: SARS-CoV-2, NAA: NOT DETECTED

## 2021-01-01 ENCOUNTER — Other Ambulatory Visit: Payer: Self-pay | Admitting: Internal Medicine

## 2021-01-01 ENCOUNTER — Other Ambulatory Visit: Payer: Self-pay

## 2021-01-01 ENCOUNTER — Other Ambulatory Visit (INDEPENDENT_AMBULATORY_CARE_PROVIDER_SITE_OTHER): Payer: Medicare Other

## 2021-01-01 DIAGNOSIS — E785 Hyperlipidemia, unspecified: Secondary | ICD-10-CM

## 2021-01-01 DIAGNOSIS — Z0000 Encounter for general adult medical examination without abnormal findings: Secondary | ICD-10-CM

## 2021-01-01 DIAGNOSIS — I10 Essential (primary) hypertension: Secondary | ICD-10-CM

## 2021-01-01 LAB — COMPREHENSIVE METABOLIC PANEL
ALT: 20 U/L (ref 0–53)
AST: 25 U/L (ref 0–37)
Albumin: 4.4 g/dL (ref 3.5–5.2)
Alkaline Phosphatase: 46 U/L (ref 39–117)
BUN: 13 mg/dL (ref 6–23)
CO2: 29 mEq/L (ref 19–32)
Calcium: 9.8 mg/dL (ref 8.4–10.5)
Chloride: 103 mEq/L (ref 96–112)
Creatinine, Ser: 1.04 mg/dL (ref 0.40–1.50)
GFR: 73.7 mL/min (ref >60.00)
Glucose, Bld: 93 mg/dL (ref 70–99)
Potassium: 4.8 mEq/L (ref 3.5–5.1)
Sodium: 139 mEq/L (ref 135–145)
Total Bilirubin: 0.8 mg/dL (ref 0.2–1.2)
Total Protein: 7.1 g/dL (ref 6.0–8.3)

## 2021-01-01 LAB — LIPID PANEL
Cholesterol: 176 mg/dL (ref 0–200)
HDL: 54.3 mg/dL (ref >39.00)
LDL Cholesterol: 102 mg/dL — ABNORMAL HIGH (ref 0–99)
NonHDL: 121.77
Total CHOL/HDL Ratio: 3
Triglycerides: 100 mg/dL (ref 0.0–149.0)
VLDL: 20 mg/dL (ref 0.0–40.0)

## 2021-01-01 LAB — CBC WITH DIFFERENTIAL/PLATELET
Basophils Absolute: 0.1 10*3/uL (ref 0.0–0.1)
Basophils Relative: 1.2 % (ref 0.0–3.0)
Eosinophils Absolute: 0.2 10*3/uL (ref 0.0–0.7)
Eosinophils Relative: 3.5 % (ref 0.0–5.0)
HCT: 43.6 % (ref 39.0–52.0)
Hemoglobin: 14.8 g/dL (ref 13.0–17.0)
Lymphocytes Relative: 30 % (ref 12.0–46.0)
Lymphs Abs: 2.1 10*3/uL (ref 0.7–4.0)
MCHC: 33.9 g/dL (ref 30.0–36.0)
MCV: 90.6 fl (ref 78.0–100.0)
Monocytes Absolute: 0.6 10*3/uL (ref 0.1–1.0)
Monocytes Relative: 8.9 % (ref 3.0–12.0)
Neutro Abs: 4 10*3/uL (ref 1.4–7.7)
Neutrophils Relative %: 56.4 % (ref 43.0–77.0)
Platelets: 192 10*3/uL (ref 150.0–400.0)
RBC: 4.81 Mil/uL (ref 4.22–5.81)
RDW: 12.8 % (ref 11.5–15.5)
WBC: 7.1 10*3/uL (ref 4.0–10.5)

## 2021-01-01 LAB — TSH: TSH: 2.46 u[IU]/mL (ref 0.35–4.50)

## 2021-01-01 LAB — HEMOGLOBIN A1C: Hgb A1c MFr Bld: 5.3 % (ref 4.6–6.5)

## 2021-01-01 LAB — PSA: PSA: 0.54 ng/mL (ref 0.10–4.00)

## 2021-01-09 ENCOUNTER — Other Ambulatory Visit: Payer: Self-pay

## 2021-01-09 ENCOUNTER — Ambulatory Visit (INDEPENDENT_AMBULATORY_CARE_PROVIDER_SITE_OTHER): Payer: Medicare Other | Admitting: Internal Medicine

## 2021-01-09 ENCOUNTER — Encounter: Payer: Self-pay | Admitting: Internal Medicine

## 2021-01-09 VITALS — BP 170/80 | HR 73 | Temp 97.9°F | Resp 18 | Ht 69.0 in | Wt 192.2 lb

## 2021-01-09 DIAGNOSIS — Z0000 Encounter for general adult medical examination without abnormal findings: Secondary | ICD-10-CM | POA: Diagnosis not present

## 2021-01-09 DIAGNOSIS — Z23 Encounter for immunization: Secondary | ICD-10-CM | POA: Diagnosis not present

## 2021-01-09 NOTE — Patient Instructions (Signed)
Check the  blood pressure 2 or 3 times a   week   BP GOAL is between 110/65 and  135/85. If it is consistently higher or lower, let me know   GO TO THE FRONT DESK, PLEASE SCHEDULE YOUR APPOINTMENTS Come back for   a check up in 3 months

## 2021-01-09 NOTE — Progress Notes (Signed)
Subjective:    Patient ID: Ralph [name], male    DOB: 15-Oct-1951, 70 y.o.   MRN: [id number]  DOS:  01/09/2021 Type of visit - description: cpx  In general feels well. We did talk about his blood pressure.    Review of Systems  A 14 point review of systems is negative    Past Medical History:  Diagnosis Date  . Hyperlipidemia 2007  . Hypertension 2007  . Inguinal hernia bilateral, non-recurrent   . Normal cardiac stress test 2008   per pt    Past Surgical History:  Procedure Laterality Date  . INGUINAL HERNIA REPAIR Bilateral 05/31/2018   Procedure: LAPAROSCOPIC BILATERAL INGUINAL HERNIA REPAIR ERAS PATHWAY;  Surgeon: Coralie Keens, MD;  Location: [place];  Service: General;  Laterality: Bilateral;  . INGUINAL HERNIA REPAIR Left 10/07/2018   Procedure: OPEN LEFT INGUINAL HERNIA REPAIR ERAS PATHWAY;  Surgeon: Coralie Keens, MD;  Location: [place];  Service: General;  Laterality: Left;  TAP BLOCK  . INSERTION OF MESH Bilateral 05/31/2018   Procedure: INSERTION OF MESH;  Surgeon: Coralie Keens, MD;  Location: [place];  Service: General;  Laterality: Bilateral;  . INSERTION OF MESH Left 10/07/2018   Procedure: INSERTION OF MESH;  Surgeon: Coralie Keens, MD;  Location: [place];  Service: General;  Laterality: Left;    Allergies as of 01/09/2021      Reactions   Penicillins    UNSPECIFIED CHILDHOOD REACTION  Has patient had a PCN reaction causing immediate rash, facial/tongue/throat swelling, SOB or lightheadedness with hypotension: Unknown Has patient had a PCN reaction causing severe rash involving mucus membranes or skin necrosis: Unknown Has patient had a PCN reaction that required hospitalization: No Has patient had a PCN reaction occurring within the last 10 years: No If all of the above answers are "NO", then may proceed with Cephalosporin use.      Medication List       Accurate as of January 09, 2021 11:59 PM. If you  have any questions, ask your nurse or doctor.        atorvastatin 40 MG tablet Commonly known as: LIPITOR TAKE ONE-HALF TABLET BY  MOUTH DAILY   fluticasone 50 MCG/ACT nasal spray Commonly known as: FLONASE Place 2 sprays into both nostrils daily as needed for allergies or rhinitis.   losartan 100 MG tablet Commonly known as: COZAAR Take 1 tablet (100 mg total) by mouth daily.   metoprolol succinate 50 MG 24 hr tablet Commonly known as: TOPROL-XL Take 0.5 tablets (25 mg total) by mouth daily. Take with or immediately following a meal. What changed:   how much to take  additional instructions Changed by: Kathlene November, MD          Objective:   Physical Exam BP (!) 170/80 (BP Location: Left Arm, Patient Position: Sitting, Cuff Size: Normal)   Pulse 73   Temp 97.9 F (36.6 C) (Oral)   Resp 18   Ht 5\' 9"  (1.753 m)   Wt 192 lb 4 oz (87.2 kg)   SpO2 99%   BMI 28.39 kg/m  General: Well developed, NAD, BMI noted Neck: No  thyromegaly  HEENT:  Normocephalic . Face symmetric, atraumatic Lungs:  CTA B Normal respiratory effort, no intercostal retractions, no accessory muscle use. Heart: RRR,  no murmur.  Abdomen:  Not distended, soft, non-tender. No rebound or rigidity.  DRE: Normal sphincter tone, no stools, prostate normal Lower extremities: no pretibial edema bilaterally  Skin:  Exposed areas without rash. Not pale. Not jaundice Neurologic:  alert & oriented X3.  Speech normal, gait appropriate for age and unassisted Strength symmetric and appropriate for age.  Psych: Cognition and judgment appear intact.  Cooperative with normal attention span and concentration.  Behavior appropriate. No anxious or depressed appearing.     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 Normal stress test 2008 per patient + FH CAD: Aspirin daily  PLAN Here for CPX HTN: Back in December, BPs were in the low side: 108/73,  heart rate in the 50s or 60s, he would feel somewhat dizzy at times.  Self decrease losartan and metoprolol to half tablet. Since then ambulatory BPs are in the 120s.  Today at home early in the morning was 180 and upon arrival here at the office 191/105.  Recheck: 170/80.  He admits he is somewhat nervous.  We agreed on increase losartan back to a full tablet, continue with half of metoprolol, monitor BPs and come back in 3 months.  See AVS. Hyperlipidemia: On atorvastatin 20 mg daily, LDL 102.  CV RF is still   elevated, with talk about possibly increase atorvastatin but he is electing to stay on moderate intensity statins. RTC 6 months    This visit occurred during the SARS-CoV-2 public health emergency.  Safety protocols were in place, including screening questions prior to the visit, additional usage of staff PPE, and extensive cleaning of exam room while observing appropriate contact time as indicated for disinfecting solutions.

## 2021-01-09 NOTE — Progress Notes (Unsigned)
Pre visit review using our clinic review tool, if applicable. No additional management support is needed unless otherwise documented below in the visit note. 

## 2021-01-10 ENCOUNTER — Encounter: Payer: Self-pay | Admitting: Internal Medicine

## 2021-01-10 NOTE — Assessment & Plan Note (Signed)
Here for CPX HTN: Back in December, BPs were in the low side: 108/73, heart rate in the 50s or 60s, he would feel somewhat dizzy at times.  Self decrease losartan and metoprolol to half tablet. Since then ambulatory BPs are in the 120s.  Today at home early in the morning was 180 and upon arrival here at the office 191/105.  Recheck: 170/80.  He admits he is somewhat nervous.  We agreed on increase losartan back to a full tablet, continue with half of metoprolol, monitor BPs and come back in 3 months.  See AVS. Hyperlipidemia: On atorvastatin 20 mg daily, LDL 102.  CV RF is still   elevated, with talk about possibly increase atorvastatin but he is electing to stay on moderate intensity statins. RTC 6 months

## 2021-01-10 NOTE — Assessment & Plan Note (Signed)
--  Td 2012 --PNM 13: 2020.  PNM 23: Today - zostavax: 2012 - shingrix Rx printed last year -COVID vaccines x 3 - Flu shot today --Prostate ca screening:   DRE normal today.  Recent PSA normal --Cscope 01-2006, saw [facility] 04/2018, Cscope June 06/2018, benign bx, 10 years per GI letter to the patient --Diet-exercise: Discussed -Labs: Done on 01/01/2021, reviewed with the patient.

## 2021-01-12 ENCOUNTER — Other Ambulatory Visit: Payer: Self-pay | Admitting: Internal Medicine

## 2021-03-11 ENCOUNTER — Encounter: Payer: Self-pay | Admitting: Internal Medicine

## 2021-03-28 ENCOUNTER — Ambulatory Visit: Payer: Medicare Other | Admitting: Dermatology

## 2021-03-28 ENCOUNTER — Other Ambulatory Visit: Payer: Self-pay

## 2021-03-28 DIAGNOSIS — L578 Other skin changes due to chronic exposure to nonionizing radiation: Secondary | ICD-10-CM | POA: Diagnosis not present

## 2021-03-28 DIAGNOSIS — L718 Other rosacea: Secondary | ICD-10-CM | POA: Diagnosis not present

## 2021-03-28 DIAGNOSIS — L570 Actinic keratosis: Secondary | ICD-10-CM | POA: Diagnosis not present

## 2021-03-28 DIAGNOSIS — L739 Follicular disorder, unspecified: Secondary | ICD-10-CM

## 2021-03-28 DIAGNOSIS — L814 Other melanin hyperpigmentation: Secondary | ICD-10-CM | POA: Diagnosis not present

## 2021-03-28 DIAGNOSIS — L821 Other seborrheic keratosis: Secondary | ICD-10-CM

## 2021-03-28 MED ORDER — KETOCONAZOLE 2 % EX SHAM: 1.0000 "application " | MEDICATED_SHAMPOO | CUTANEOUS | 6 refills | Status: DC

## 2021-03-28 MED ORDER — METRONIDAZOLE 0.75 % EX LOTN: 1.0000 "application " | TOPICAL_LOTION | Freq: Every day | CUTANEOUS | 6 refills | Status: DC

## 2021-03-28 NOTE — Patient Instructions (Signed)
Instructions for [facility] Medications  One or more of your medications was sent to the [facility] mail order compounding pharmacy. You will receive an email from them and can purchase the medicine through that link. It will then be mailed to your home at the address you confirmed. If for any reason you do not receive an email from them, please check your spam folder. If you still do not find the email, please let us know. [facility] phone number is [phone].  Apply cream to scalp and forehead twice daily for 7 days. Wait about 3 weeks and apply to temples and cheeks twice daily for 7 days.  Cryotherapy Aftercare  . Wash gently with soap and water everyday.   Marland Kitchen Apply Vaseline and Band-Aid daily until healed.   If you have any questions or concerns for your doctor, please call our main line at [phone] and press option 4 to reach your doctor's medical assistant. If no one answers, please leave a voicemail as directed and we will return your call as soon as possible. Messages left after 4 pm will be answered the following business day.   You may also send [place] a message via [place]. We typically respond to MyChart messages within 1-2 business days.  For prescription refills, please ask your pharmacy to contact our office. Our fax number is [phone].  If you have an urgent issue when the clinic is closed that cannot wait until the next business day, you can page your doctor at the number below.    Please note that while we do our best to be available for urgent issues outside of office hours, we are not available 24/7.   If you have an urgent issue and are unable to reach [place], you may choose to seek medical care at your doctor's office, retail clinic, urgent care center, or emergency room.  If you have a medical emergency, please immediately call 911 or go to the emergency department.  Pager Numbers  - Dr. Nehemiah Massed: [phone]  - Dr. Laurence Ferrari: [phone]  - Dr.  Nicole Kindred: [phone]  In the event of inclement weather, please call our main line at [phone] for an update on the status of any delays or closures.  Dermatology Medication Tips: Please keep the boxes that topical medications come in in order to help keep track of the instructions about where and how to use these. Pharmacies typically print the medication instructions only on the boxes and not directly on the medication tubes.   If your medication is too expensive, please contact our office at [phone] option 4 or send [place] a message through [place].   We are unable to tell what your co-pay for medications will be in advance as this is different depending on your insurance coverage. However, we may be able to find a substitute medication at lower cost or fill out paperwork to get insurance to cover a needed medication.   If a prior authorization is required to get your medication covered by your insurance company, please allow [place] 1-2 business days to complete this process.  Drug prices often vary depending on where the prescription is filled and some pharmacies may offer cheaper prices.  The website www.goodrx.com contains coupons for medications through different pharmacies. The prices here do not account for what the cost may be with help from insurance (it may be cheaper with your insurance), but the website can give you the price if you did not use any insurance.  - You can  print the associated coupon and take it with your prescription to the pharmacy.  - You may also stop by our office during regular business hours and pick up a GoodRx coupon card.  - If you need your prescription sent electronically to a different pharmacy, notify our office through [facility] or by phone at [phone] option 4.

## 2021-03-28 NOTE — Progress Notes (Signed)
New Patient Visit  Subjective  Ralph [name] is a 70 y.o. male who presents for the following: Actinic Keratosis (History of AK - has used fluoroplex in the past. He has some scaly spots ).  The following portions of the chart were reviewed this encounter and updated as appropriate:   Tobacco  Allergies  Meds  Problems  Med Hx  Surg Hx  Fam Hx     Review of Systems:  No other skin or systemic complaints except as noted in HPI or Assessment and Plan.  Objective  Well appearing patient in no apparent distress; mood and affect are within normal limits.  A focused examination was performed including scalp, face, arms. Relevant physical exam findings are noted in the Assessment and Plan.  Objective  Scalp, face (16): Erythematous thin papules/macules with gritty scale.    Assessment & Plan    Actinic Damage - chronic, secondary to cumulative UV radiation exposure/sun exposure over time - diffuse scaly erythematous macules with underlying dyspigmentation - Recommend daily broad spectrum sunscreen SPF 30+ to sun-exposed areas, reapply every 2 hours as needed.  - Recommend staying in the shade or wearing long sleeves, sun glasses (UVA+UVB protection) and wide brim hats (4-inch brim around the entire circumference of the hat). - Call for new or changing lesions.  Seborrheic Keratoses - Stuck-on, waxy, tan-brown papules and/or plaques  - Benign-appearing - Discussed benign etiology and prognosis. - Observe - Call for any changes  Lentigines - Scattered tan macules - Due to sun exposure - Benign-appering, observe - Recommend daily broad spectrum sunscreen SPF 30+ to sun-exposed areas, reapply every 2 hours as needed. - Call for any changes  AK (actinic keratosis) x 16 Scalp Actinic Damage - Severe, confluent actinic changes with pre-cancerous actinic keratoses  - Severe, chronic, not at goal, secondary to cumulative UV radiation exposure over time - diffuse scaly  erythematous macules and papules with underlying dyspigmentation - Discussed Prescription "Field Treatment" for Severe, Chronic Confluent Actinic Changes with Pre-Cancerous Actinic Keratoses Field treatment involves treatment of an entire area of skin that has confluent Actinic Changes (Sun/ Ultraviolet light damage) and PreCancerous Actinic Keratoses by method of PhotoDynamic Therapy (PDT) and/or prescription Topical Chemotherapy agents such as 5-fluorouracil, 5-fluorouracil/calcipotriene, and/or imiquimod.  The purpose is to decrease the number of clinically evident and subclinical PreCancerous lesions to prevent progression to development of skin cancer by chemically destroying early precancer changes that may or may not be visible.  It has been shown to reduce the risk of developing skin cancer in the treated area. As a result of treatment, redness, scaling, crusting, and open sores may occur during treatment course. One or more than one of these methods may be used and may have to be used several times to control, suppress and eliminate the PreCancerous changes. Discussed treatment course, expected reaction, and possible side effects. - Recommend daily broad spectrum sunscreen SPF 30+ to sun-exposed areas, reapply every 2 hours as needed.  - Staying in the shade or wearing long sleeves, sun glasses (UVA+UVB protection) and wide brim hats (4-inch brim around the entire circumference of the hat) are also recommended. - Call for new or changing lesions.   - Start 5-fluorouracil/calcipotriene cream twice a day for 7 days to affected areas including scalp, face. Prescription sent to [facility]. Patient advised they will receive an email to purchase the medication online and have it sent to their home. Patient provided with handout reviewing treatment course and side effects  and advised to call or message [place] on MyChart with any concerns.   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 It is treatable but not curable. It sometimes affects the eyes (ocular rosacea) as well. It may respond to topical and/or systemic medication and can flare with stress, sun exposure, alcohol, exercise and some foods.  Daily application of broad spectrum spf 30+ sunscreen to face is recommended to reduce flares.  METRONIDAZOLE, TOPICAL, (METROLOTION) 0.75 % LOTN - Scalp  Other Related Medications ketoconazole (NIZORAL) 2 % shampoo  Return in about 6 months (around 09/27/2021).   I, Ashok Cordia, CMA, am acting as scribe for Sarina Ser, MD .  Documentation: I have reviewed the above documentation for accuracy and completeness, and I agree with the above.  Sarina Ser, MD

## 2021-04-01 ENCOUNTER — Encounter: Payer: Self-pay | Admitting: Dermatology

## 2021-04-01 ENCOUNTER — Other Ambulatory Visit: Payer: Self-pay

## 2021-04-01 DIAGNOSIS — L739 Follicular disorder, unspecified: Secondary | ICD-10-CM

## 2021-04-01 MED ORDER — KETOCONAZOLE 2 % EX SHAM: 1.0000 "application " | MEDICATED_SHAMPOO | CUTANEOUS | 6 refills | Status: DC

## 2021-04-01 NOTE — Progress Notes (Signed)
Refill request received from [facility] - request responded to electronically.

## 2021-04-02 ENCOUNTER — Encounter: Payer: Self-pay | Admitting: Dermatology

## 2021-04-02 ENCOUNTER — Telehealth: Payer: Self-pay

## 2021-04-02 NOTE — Telephone Encounter (Signed)
Left patient a voicemail to return my call

## 2021-04-02 NOTE — Telephone Encounter (Signed)
Sent in Triple Rosacea Cream to [facility]. Advised patient.

## 2021-04-03 NOTE — Telephone Encounter (Signed)
Patient advised by Ashok Cordia CMA

## 2021-04-17 DIAGNOSIS — I8311 Varicose veins of right lower extremity with inflammation: Secondary | ICD-10-CM | POA: Diagnosis not present

## 2021-04-17 DIAGNOSIS — I83811 Varicose veins of right lower extremities with pain: Secondary | ICD-10-CM | POA: Diagnosis not present

## 2021-04-18 DIAGNOSIS — I83811 Varicose veins of right lower extremities with pain: Secondary | ICD-10-CM | POA: Diagnosis not present

## 2021-04-18 DIAGNOSIS — I8311 Varicose veins of right lower extremity with inflammation: Secondary | ICD-10-CM | POA: Diagnosis not present

## 2021-04-25 ENCOUNTER — Telehealth: Payer: Self-pay

## 2021-04-25 MED ORDER — MOMETASONE FUROATE 0.1 % EX CREA: 1.0000 "application " | TOPICAL_CREAM | CUTANEOUS | 0 refills | Status: DC

## 2021-04-25 NOTE — Telephone Encounter (Signed)
Patient called regarding using the 5FU and Calcipotriene compound cream. Patient was only able to use for four days and had to d/c due to reaction. Patient states scalp, forehead, temples and cheeks are very red and his eyes are swollen. Patient states he only applied to scalp and forehead. He would like to know can we send in anything for relief?

## 2021-04-25 NOTE — Telephone Encounter (Signed)
Send Mometasone cream bid for up to 2 weeks.  Disp 30 g 0rf

## 2021-04-25 NOTE — Telephone Encounter (Signed)
RX sent in and patient advised. 

## 2021-08-07 DIAGNOSIS — I83811 Varicose veins of right lower extremities with pain: Secondary | ICD-10-CM | POA: Diagnosis not present

## 2021-08-07 DIAGNOSIS — I8311 Varicose veins of right lower extremity with inflammation: Secondary | ICD-10-CM | POA: Diagnosis not present

## 2021-08-07 DIAGNOSIS — S8011XA Contusion of right lower leg, initial encounter: Secondary | ICD-10-CM | POA: Diagnosis not present

## 2021-08-26 DIAGNOSIS — H25813 Combined forms of age-related cataract, bilateral: Secondary | ICD-10-CM | POA: Diagnosis not present

## 2021-09-01 ENCOUNTER — Other Ambulatory Visit: Payer: Self-pay | Admitting: Internal Medicine

## 2021-09-04 DIAGNOSIS — I83811 Varicose veins of right lower extremities with pain: Secondary | ICD-10-CM | POA: Diagnosis not present

## 2021-09-04 DIAGNOSIS — I8311 Varicose veins of right lower extremity with inflammation: Secondary | ICD-10-CM | POA: Diagnosis not present

## 2021-09-04 DIAGNOSIS — S8011XA Contusion of right lower leg, initial encounter: Secondary | ICD-10-CM | POA: Diagnosis not present

## 2021-09-04 DIAGNOSIS — I8312 Varicose veins of left lower extremity with inflammation: Secondary | ICD-10-CM | POA: Diagnosis not present

## 2021-09-25 ENCOUNTER — Telehealth: Payer: Self-pay

## 2021-09-25 NOTE — Telephone Encounter (Signed)
1 RF of rosacea cream sent in through [facility] portal.

## 2021-10-09 ENCOUNTER — Other Ambulatory Visit: Payer: Self-pay

## 2021-10-09 ENCOUNTER — Ambulatory Visit: Payer: Medicare Other | Admitting: Dermatology

## 2021-10-09 DIAGNOSIS — L570 Actinic keratosis: Secondary | ICD-10-CM

## 2021-10-09 DIAGNOSIS — L578 Other skin changes due to chronic exposure to nonionizing radiation: Secondary | ICD-10-CM

## 2021-10-09 DIAGNOSIS — L820 Inflamed seborrheic keratosis: Secondary | ICD-10-CM

## 2021-10-09 DIAGNOSIS — L821 Other seborrheic keratosis: Secondary | ICD-10-CM

## 2021-10-09 DIAGNOSIS — L718 Other rosacea: Secondary | ICD-10-CM | POA: Diagnosis not present

## 2021-10-09 DIAGNOSIS — L719 Rosacea, unspecified: Secondary | ICD-10-CM

## 2021-10-09 NOTE — Patient Instructions (Addendum)

## 2021-10-09 NOTE — Progress Notes (Signed)
Follow-Up Visit   Subjective  Ralph [name] is a 70 y.o. male who presents for the following: Actinic Keratosis (6  months f/u Aks on the face and scalp, treated with LN2 followed by 5-FU/calcipotriene field treatment.) and Rosacea (6 months f/u rosacea on the scalp treating with [facility] rosacea triple cream  and Ketoconazole shampoo ).  The following portions of the chart were reviewed this encounter and updated as appropriate:   Tobacco  Allergies  Meds  Problems  Med Hx  Surg Hx  Fam Hx     Review of Systems:  No other skin or systemic complaints except as noted in HPI or Assessment and Plan.  Objective  Well appearing patient in no apparent distress; mood and affect are within normal limits.  A focused examination was performed including face,scalp. Relevant physical exam findings are noted in the Assessment and Plan.  face (17) Erythematous thin papules/macules with gritty scale.   right upper eyelid, right nasal bridge, left nasal bridge, back (4) Erythematous keratotic or waxy stuck-on papule or plaque.   Scalp Mid face erythema with telangiectasias +/- scattered inflammatory papules.    Assessment & Plan  AK (actinic keratosis) (17) face  Destruction of lesion - face Complexity: simple   Destruction method: cryotherapy   Informed consent: discussed and consent obtained   Timeout:  patient name, date of birth, surgical site, and procedure verified Lesion destroyed using liquid nitrogen: Yes   Region frozen until ice ball extended beyond lesion: Yes   Outcome: patient tolerated procedure well with no complications   Post-procedure details: wound care instructions given    Actinic Damage - Severe, confluent actinic changes with pre-cancerous actinic keratoses  - Severe, chronic, not at goal, secondary to cumulative UV radiation exposure over time - diffuse scaly erythematous macules and papules with underlying dyspigmentation - Discussed Prescription  "Field Treatment" for Severe, Chronic Confluent Actinic Changes with Pre-Cancerous Actinic Keratoses Field treatment involves treatment of an entire area of skin that has confluent Actinic Changes (Sun/ Ultraviolet light damage) and PreCancerous Actinic Keratoses by method of PhotoDynamic Therapy (PDT) and/or prescription Topical Chemotherapy agents such as 5-fluorouracil, 5-fluorouracil/calcipotriene, and/or imiquimod.  The purpose is to decrease the number of clinically evident and subclinical PreCancerous lesions to prevent progression to development of skin cancer by chemically destroying early precancer changes that may or may not be visible.  It has been shown to reduce the risk of developing skin cancer in the treated area. As a result of treatment, redness, scaling, crusting, and open sores may occur during treatment course. One or more than one of these methods may be used and may have to be used several times to control, suppress and eliminate the PreCancerous changes. Discussed treatment course, expected reaction, and possible side effects. - Recommend daily broad spectrum sunscreen SPF 30+ to sun-exposed areas, reapply every 2 hours as needed.  - Staying in the shade or wearing long sleeves, sun glasses (UVA+UVB protection) and wide brim hats (4-inch brim around the entire circumference of the hat) are also recommended. - Call for new or changing lesions. -5-FU/ calcipotriene mix to the face twice daily for 1 week.  Start Rx sent  Inflamed seborrheic keratosis right upper eyelid x 1, right nasal bridge, left nasal bridge, back x 3  Destruction of lesion - right upper eyelid, right nasal bridge, left nasal bridge, back Complexity: simple   Destruction method: cryotherapy   Informed consent: discussed and consent obtained   Timeout:  patient name,  date of birth, surgical site, and procedure verified Lesion destroyed using liquid nitrogen: Yes   Region frozen until ice ball extended beyond  lesion: Yes   Outcome: patient tolerated procedure well with no complications   Post-procedure details: wound care instructions given    Rosacea Scalp Folliculitis rosacea of the scalp improved on current treatment but not to goal Continue ketoconazole shampoo and rosacea triple cream  Discussed oral treatment and isotretinoin.  Patient declines at this time. Rosacea is a chronic progressive skin condition usually affecting the face of adults, causing redness and/or acne bumps. It is treatable but not curable. It sometimes affects the eyes (ocular rosacea) as well. It may respond to topical and/or systemic medication and can flare with stress, sun exposure, alcohol, exercise and some foods.  Daily application of broad spectrum spf 30+ sunscreen to face is recommended to reduce flares.   Cont Ketoconazole shampoo Cont [facility] rosacea triple cream  Actinic Damage - chronic, secondary to cumulative UV radiation exposure/sun exposure over time - diffuse scaly erythematous macules with underlying dyspigmentation - Recommend daily broad spectrum sunscreen SPF 30+ to sun-exposed areas, reapply every 2 hours as needed.  - Recommend staying in the shade or wearing long sleeves, sun glasses (UVA+UVB protection) and wide brim hats (4-inch brim around the entire circumference of the hat). - Call for new or changing lesions.   Seborrheic Keratoses - Stuck-on, waxy, tan-brown papules and/or plaques  - Benign-appearing - Discussed benign etiology and prognosis. - Observe - Call for any changes  Return in about 1 year (around 10/09/2022).  IMarye Round, CMA, am acting as scribe for Sarina Ser, MD .  Documentation: I have reviewed the above documentation for accuracy and completeness, and I agree with the above.  Sarina Ser, MD

## 2021-10-10 ENCOUNTER — Encounter: Payer: Self-pay | Admitting: Dermatology

## 2021-10-15 ENCOUNTER — Telehealth: Payer: Self-pay | Admitting: Internal Medicine

## 2021-10-15 NOTE — Telephone Encounter (Signed)
Left message for patient to call back and schedule Medicare Annual Wellness Visit (AWV) in office.  ° °If not able to come in office, please offer to do virtually or by telephone.  Left office number and my jabber #[phone]. ° °Due for AWVI ° °Please schedule at anytime with Nurse Health Advisor. °  °

## 2021-10-21 ENCOUNTER — Ambulatory Visit (INDEPENDENT_AMBULATORY_CARE_PROVIDER_SITE_OTHER): Payer: Medicare Other | Admitting: Ophthalmology

## 2021-10-21 ENCOUNTER — Encounter (INDEPENDENT_AMBULATORY_CARE_PROVIDER_SITE_OTHER): Payer: Self-pay | Admitting: Ophthalmology

## 2021-10-21 ENCOUNTER — Other Ambulatory Visit: Payer: Self-pay

## 2021-10-21 DIAGNOSIS — H25813 Combined forms of age-related cataract, bilateral: Secondary | ICD-10-CM | POA: Diagnosis not present

## 2021-10-21 DIAGNOSIS — H3581 Retinal edema: Secondary | ICD-10-CM

## 2021-10-21 DIAGNOSIS — I10 Essential (primary) hypertension: Secondary | ICD-10-CM | POA: Diagnosis not present

## 2021-10-21 DIAGNOSIS — H43811 Vitreous degeneration, right eye: Secondary | ICD-10-CM

## 2021-10-21 DIAGNOSIS — H35033 Hypertensive retinopathy, bilateral: Secondary | ICD-10-CM

## 2021-10-21 NOTE — Progress Notes (Signed)
[facility] Note  10/21/2021     CHIEF COMPLAINT Patient presents for Retina Evaluation   HISTORY OF PRESENT ILLNESS: Ralph [name] is a 70 y.o. male who presents to the clinic today for:   HPI     Retina Evaluation   In right eye.  This started 1 day ago.  Duration of 1 day.  Associated Symptoms Flashes and Floaters.  Context:  distance vision, mid-range vision and near vision.  Treatments tried include no treatments.  I, the attending physician,  performed the HPI with the patient and updated documentation appropriately.        Comments   70 y/o male pt.  Walk-in.  Self-referral for eval of new, sudden onset floaters OD x 1 day.  Pt was playing golf yesterday, and suddenly saw "spiderweb" like floaters OD, that have been constant since, but not tend to linger in his temporal vision.  Pt also saw a few intermittent FOL OD last night.  No problems reported OS.  No change in [place] OU.  Denies pain, headaches.  No gtts.      Last edited by Bernarda Caffey, MD on 10/21/2021 10:45 PM.    Pt is here on self referral for new fol and floaters, pt states he was playing golf on Saturday and noticed a spider web in his vision, pt is friends with Delila Spence and talked to him last night, Mr. Skoda advised the pt to be seen today, pt had a routine eye exam with Dr. Gloriann Loan in [place] 2 months ago  Referring physician: Colon Branch, MD [address] [place],  [place] [address]  HISTORICAL INFORMATION:   Selected notes from the [facility].  Self-referral for eval of sudden onset floaters OD.   CURRENT MEDICATIONS: No current outpatient medications on file. (Ophthalmic Drugs)   No current facility-administered medications for this visit. (Ophthalmic Drugs)   Current Outpatient Medications (Other)  Medication Sig   atorvastatin (LIPITOR) 40 MG tablet TAKE ONE-HALF TABLET BY  MOUTH DAILY   losartan (COZAAR) 100 MG tablet TAKE 1 TABLET  BY MOUTH  DAILY   fluticasone (FLONASE) 50 MCG/ACT nasal spray Place 2 sprays into both nostrils daily as needed for allergies or rhinitis.   ketoconazole (NIZORAL) 2 % shampoo Apply 1 application topically 2 (two) times a week.   metoprolol succinate (TOPROL-XL) 50 MG 24 hr tablet Take 0.5 tablets (25 mg total) by mouth daily. Take with or immediately following a meal.   mometasone (ELOCON) 0.1 % cream Apply 1 application topically as directed. Apply thin layer to the affected areas twice daily for up to two weeks   No current facility-administered medications for this visit. (Other)   REVIEW OF SYSTEMS: ROS   Positive for: HENT, Eyes Negative for: Constitutional, Gastrointestinal, Neurological, Skin, Genitourinary, Musculoskeletal, Endocrine, Cardiovascular, Respiratory, Psychiatric, Allergic/Imm, Heme/Lymph Last edited by Matthew Folks, COA on 10/21/2021  9:29 AM.     ALLERGIES Allergies  Allergen Reactions   Penicillins     UNSPECIFIED CHILDHOOD REACTION  Has patient had a PCN reaction causing immediate rash, facial/tongue/throat swelling, SOB or lightheadedness with hypotension: Unknown Has patient had a PCN reaction causing severe rash involving mucus membranes or skin necrosis: Unknown Has patient had a PCN reaction that required hospitalization: No Has patient had a PCN reaction occurring within the last 10 years: No If all of the above answers are "NO", then may proceed with Cephalosporin use.  PAST MEDICAL HISTORY Past Medical History:  Diagnosis Date   Basal cell carcinoma 05/07/2015   R mid back    Hyperlipidemia 2007   Hypertension 2007   Inguinal hernia bilateral, non-recurrent    Normal cardiac stress test 2008   per pt   Past Surgical History:  Procedure Laterality Date   INGUINAL HERNIA REPAIR Bilateral 05/31/2018   Procedure: LAPAROSCOPIC BILATERAL INGUINAL HERNIA REPAIR ERAS PATHWAY;  Surgeon: Coralie Keens, MD;  Location: [facility];  Service: General;  Laterality: Bilateral;   INGUINAL HERNIA REPAIR Left 10/07/2018   Procedure: OPEN LEFT INGUINAL HERNIA REPAIR ERAS PATHWAY;  Surgeon: Coralie Keens, MD;  Location: [facility];  Service: General;  Laterality: Left;  TAP BLOCK   INSERTION OF MESH Bilateral 05/31/2018   Procedure: INSERTION OF MESH;  Surgeon: Coralie Keens, MD;  Location: [facility];  Service: General;  Laterality: Bilateral;   INSERTION OF MESH Left 10/07/2018   Procedure: INSERTION OF MESH;  Surgeon: Coralie Keens, MD;  Location: [facility];  Service: General;  Laterality: Left;   FAMILY HISTORY Family History  Problem Relation Age of Onset   Breast cancer Mother    Bone cancer Mother    Coronary artery disease Father        w/ first MI age 62, GF MI in the early 29s   Hypertension Sister    Macular degeneration Maternal Aunt    Prostate cancer Neg Hx    Colon cancer Neg Hx    Diabetes Neg Hx    SOCIAL HISTORY Social History   Tobacco Use   Smoking status: Never   Smokeless tobacco: Never  Vaping Use   Vaping Use: Never used  Substance Use Topics   Alcohol use: Yes    Comment: socially   Drug use: No       OPHTHALMIC EXAM: Base Eye Exam     Visual Acuity (Snellen - Linear)       Right Left   Dist [place] 20/20 - 20/20         Tonometry (Tonopen, 9:33 AM)       Right Left   Pressure 11 14         Pupils       Dark Light Shape React APD   Right 3 2 Round Minimal None   Left 3 2 Round Minimal None         Visual Fields (Counting fingers)       Left Right    Full Full         Extraocular Movement       Right Left    Full, Ortho Full, Ortho         Neuro/Psych     Oriented x3: Yes   Mood/Affect: Normal         Dilation     Both eyes: 1.0% Mydriacyl, 2.5% Phenylephrine @ 9:33 AM           Slit Lamp and Fundus Exam     Slit Lamp Exam       Right Left   Lids/Lashes Dermatochalasis - upper lid Dermatochalasis - upper lid    Conjunctiva/Sclera White and quiet White and quiet   Cornea arcus, 1+ Punctate epithelial erosions arcus, 1+ Punctate epithelial erosions   Anterior Chamber Deep and quiet Deep and quiet   Iris Round and dilated, PPM IN Round and dilated, PPM IN   Lens 2+ Nuclear sclerosis, 2+ Cortical cataract 2+ Nuclear sclerosis, 2+  Cortical cataract   Vitreous Vitreous syneresis, no pigment, low Posterior vitreous detachment, Weiss ring, vitreous condensations clear         Fundus Exam       Right Left   Disc Pink and Sharp Pink and Sharp   C/D Ratio 0.4 0.5   Macula Flat, Good foveal reflex, RPE mottling, No heme or edema Flat, Good foveal reflex, RPE mottling, No heme or edema   Vessels Normal mild attenuation, mild Copper wiring   Periphery Attached    Attached              Refraction     Manifest Refraction       Sphere Cylinder Dist VA   Right -0.50 Sphere 20/20   Left -0.50 Sphere 20/20           IMAGING AND PROCEDURES  Imaging and Procedures for 10/21/2021  OCT, Retina - OU - Both Eyes       Right Eye Quality was good. Central Foveal Thickness: 294. Progression has no prior data. Findings include normal foveal contour, no IRF, no SRF.   Left Eye Quality was good. Central Foveal Thickness: 297. Progression has no prior data. Findings include normal foveal contour, no IRF, no SRF, vitreomacular adhesion .   Notes *Images captured and stored on drive  Diagnosis / Impression:  NFP, no IRF/SRF OU  Clinical management:  See below  Abbreviations: NFP - Normal foveal profile. CME - cystoid macular edema. PED - pigment epithelial detachment. IRF - intraretinal fluid. SRF - subretinal fluid. EZ - ellipsoid zone. ERM - epiretinal membrane. ORA - outer retinal atrophy. ORT - outer retinal tubulation. SRHM - subretinal hyper-reflective material. IRHM - intraretinal hyper-reflective material            ASSESSMENT/PLAN:    ICD-10-CM   1. Posterior vitreous detachment  of right eye  H43.811     2. Retinal edema  H35.81 OCT, Retina - OU - Both Eyes    3. Essential hypertension  I10     4. Hypertensive retinopathy of both eyes  H35.033     5. Combined forms of age-related cataract of both eyes  H25.813      1,2. PVD / vitreous syneresis OD  - acute symptomatic flashes/floaters on Saturday, 11.5.22 while golfing  - Discussed findings and prognosis  - No RT or RD on 360 scleral depressed exam  - Reviewed s/s of RT/RD  - Strict return precautions for any such RT/RD signs/symptoms  - f/u in 4-6 wks -- DFE/OCT  3,4. Hypertensive retinopathy OU - discussed importance of tight BP control - monitor  5. Mixed Cataract OU - The symptoms of cataract, surgical options, and treatments and risks were discussed with patient. - discussed diagnosis and progression - not yet visually significant - monitor for now  Ophthalmic Meds Ordered this visit:  No orders of the defined types were placed in this encounter.    Return for f/u 4-6 weeks, PVD OD, DFE, OCT.  There are no Patient Instructions on file for this visit.  Explained the diagnoses, plan, and follow up with the patient and they expressed understanding.  Patient expressed understanding of the importance of proper follow up care.   This document serves as a record of services personally performed by Gardiner Sleeper, MD, PhD. It was created on their behalf by Estill Bakes, COT an ophthalmic technician. The creation of this record is the provider's dictation and/or activities during the visit.    Electronically  signed by: Estill Bakes, COT 11.7.22 @ 10:46 PM   This document serves as a record of services personally performed by Gardiner Sleeper, MD, PhD. It was created on their behalf by San Jetty. Owens Shark, OA an ophthalmic technician. The creation of this record is the provider's dictation and/or activities during the visit.    Electronically signed by: San Jetty. Marguerita Merles 11.07.2022 10:46 PM   Gardiner Sleeper, M.D., Ph.D. Diseases & Surgery of the Retina and [place] 11.7.22  I have reviewed the above documentation for accuracy and completeness, and I agree with the above. Gardiner Sleeper, M.D., Ph.D. 10/21/21 10:48 PM   Abbreviations: M myopia (nearsighted); A astigmatism; H hyperopia (farsighted); P presbyopia; Mrx spectacle prescription;  CTL contact lenses; OD right eye; OS left eye; OU both eyes  XT exotropia; ET esotropia; PEK punctate epithelial keratitis; PEE punctate epithelial erosions; DES dry eye syndrome; MGD meibomian gland dysfunction; ATs artificial tears; PFAT's preservative free artificial tears; [place] nuclear sclerotic cataract; PSC posterior subcapsular cataract; ERM epi-retinal membrane; PVD posterior vitreous detachment; RD retinal detachment; DM diabetes mellitus; DR diabetic retinopathy; NPDR non-proliferative diabetic retinopathy; PDR proliferative diabetic retinopathy; CSME clinically significant macular edema; DME diabetic macular edema; dbh dot blot hemorrhages; CWS cotton wool spot; POAG primary open angle glaucoma; C/D cup-to-disc ratio; HVF humphrey visual field; GVF goldmann visual field; OCT optical coherence tomography; IOP intraocular pressure; BRVO Branch retinal vein occlusion; CRVO central retinal vein occlusion; CRAO central retinal artery occlusion; BRAO branch retinal artery occlusion; RT retinal tear; SB scleral buckle; PPV pars plana vitrectomy; VH Vitreous hemorrhage; PRP panretinal laser photocoagulation; IVK intravitreal kenalog; VMT vitreomacular traction; MH Macular hole;  NVD neovascularization of the disc; NVE neovascularization elsewhere; AREDS age related eye disease study; ARMD age related macular degeneration; POAG primary open angle glaucoma; EBMD epithelial/anterior basement membrane dystrophy; ACIOL anterior chamber intraocular lens; IOL intraocular lens; PCIOL posterior chamber intraocular lens; Phaco/IOL  phacoemulsification with intraocular lens placement; [place] photorefractive keratectomy; LASIK laser assisted in situ keratomileusis; HTN hypertension; DM diabetes mellitus; COPD chronic obstructive pulmonary disease

## 2021-10-22 ENCOUNTER — Ambulatory Visit (INDEPENDENT_AMBULATORY_CARE_PROVIDER_SITE_OTHER): Payer: Medicare Other

## 2021-10-22 VITALS — Ht 69.0 in | Wt 188.0 lb

## 2021-10-22 DIAGNOSIS — Z0000 Encounter for general adult medical examination without abnormal findings: Secondary | ICD-10-CM

## 2021-10-22 NOTE — Patient Instructions (Signed)
Ralph [name] , Thank you for taking time to complete your Medicare Wellness Visit. I appreciate your ongoing commitment to your health goals. Please review the following plan we discussed and let me know if I can assist you in the future.   Screening recommendations/referrals: Colonoscopy: Completed 05/21/2018-Due 05/21/2028 Recommended yearly ophthalmology/optometry visit for glaucoma screening and checkup Recommended yearly dental visit for hygiene and checkup  Vaccinations: Influenza vaccine: Due-May obtain vaccine at our office or your local pharmacy. Pneumococcal vaccine: Up to date Tdap vaccine: Discuss with pharmacy Shingles vaccine: Discuss with pharmacy   Covid-19: Booster available at the pharmacy  Advanced directives: Please bring a copy of Living Will and/or Ruth for your chart once completed.   Conditions/risks identified: See problem list  Next appointment: Follow up in one year for your annual wellness visit.   Preventive Care 69 Years and Older, Male Preventive care refers to lifestyle choices and visits with your health care provider that can promote health and wellness. What does preventive care include? A yearly physical exam. This is also called an annual well check. Dental exams once or twice a year. Routine eye exams. Ask your health care provider how often you should have your eyes checked. Personal lifestyle choices, including: Daily care of your teeth and gums. Regular physical activity. Eating a healthy diet. Avoiding tobacco and drug use. Limiting alcohol use. Practicing safe sex. Taking low doses of aspirin every day. Taking vitamin and mineral supplements as recommended by your health care provider. What happens during an annual well check? The services and screenings done by your health care provider during your annual well check will depend on your age, overall health, lifestyle risk factors, and family history of  disease. Counseling  Your health care provider may ask you questions about your: Alcohol use. Tobacco use. Drug use. Emotional well-being. Home and relationship well-being. Sexual activity. Eating habits. History of falls. Memory and ability to understand (cognition). Work and work Statistician. Screening  You may have the following tests or measurements: Height, weight, and BMI. Blood pressure. Lipid and cholesterol levels. These may be checked every 5 years, or more frequently if you are over 12 years old. Skin check. Lung cancer screening. You may have this screening every year starting at age 59 if you have a 30-pack-year history of smoking and currently smoke or have quit within the past 15 years. Fecal occult blood test (FOBT) of the stool. You may have this test every year starting at age 23. Flexible sigmoidoscopy or colonoscopy. You may have a sigmoidoscopy every 5 years or a colonoscopy every 10 years starting at age 59. Prostate cancer screening. Recommendations will vary depending on your family history and other risks. Hepatitis C blood test. Hepatitis B blood test. Sexually transmitted disease (STD) testing. Diabetes screening. This is done by checking your blood sugar (glucose) after you have not eaten for a while (fasting). You may have this done every 1-3 years. Abdominal aortic aneurysm (AAA) screening. You may need this if you are a current or former smoker. Osteoporosis. You may be screened starting at age 3 if you are at high risk. Talk with your health care provider about your test results, treatment options, and if necessary, the need for more tests. Vaccines  Your health care provider may recommend certain vaccines, such as: Influenza vaccine. This is recommended every year. Tetanus, diphtheria, and acellular pertussis (Tdap, Td) vaccine. You may need a Td booster every 10 years. Zoster vaccine. You may need  this after age 54. Pneumococcal 13-valent  conjugate (PCV13) vaccine. One dose is recommended after age 48. Pneumococcal polysaccharide (PPSV23) vaccine. One dose is recommended after age 54. Talk to your health care provider about which screenings and vaccines you need and how often you need them. This information is not intended to replace advice given to you by your health care provider. Make sure you discuss any questions you have with your health care provider. Document Released: 12/28/2015 Document Revised: 08/20/2016 Document Reviewed: 10/02/2015 Elsevier Interactive Patient Education  2017 [employer] Prevention in the Home Falls can cause injuries. They can happen to people of all ages. There are many things you can do to make your home safe and to help prevent falls. What can I do on the outside of my home? Regularly fix the edges of walkways and driveways and fix any cracks. Remove anything that might make you trip as you walk through a door, such as a raised step or threshold. Trim any bushes or trees on the path to your home. Use bright outdoor lighting. Clear any walking paths of anything that might make someone trip, such as rocks or tools. Regularly check to see if handrails are loose or broken. Make sure that both sides of any steps have handrails. Any raised decks and porches should have guardrails on the edges. Have any leaves, snow, or ice cleared regularly. Use sand or salt on walking paths during winter. Clean up any spills in your garage right away. This includes oil or grease spills. What can I do in the bathroom? Use night lights. Install grab bars by the toilet and in the tub and shower. Do not use towel bars as grab bars. Use non-skid mats or decals in the tub or shower. If you need to sit down in the shower, use a plastic, non-slip stool. Keep the floor dry. Clean up any water that spills on the floor as soon as it happens. Remove soap buildup in the tub or shower regularly. Attach bath mats  securely with double-sided non-slip rug tape. Do not have throw rugs and other things on the floor that can make you trip. What can I do in the bedroom? Use night lights. Make sure that you have a light by your bed that is easy to reach. Do not use any sheets or blankets that are too big for your bed. They should not hang down onto the floor. Have a firm chair that has side arms. You can use this for support while you get dressed. Do not have throw rugs and other things on the floor that can make you trip. What can I do in the kitchen? Clean up any spills right away. Avoid walking on wet floors. Keep items that you use a lot in easy-to-reach places. If you need to reach something above you, use a strong step stool that has a grab bar. Keep electrical cords out of the way. Do not use floor polish or wax that makes floors slippery. If you must use wax, use non-skid floor wax. Do not have throw rugs and other things on the floor that can make you trip. What can I do with my stairs? Do not leave any items on the stairs. Make sure that there are handrails on both sides of the stairs and use them. Fix handrails that are broken or loose. Make sure that handrails are as long as the stairways. Check any carpeting to make sure that it is firmly attached to  the stairs. Fix any carpet that is loose or worn. Avoid having throw rugs at the top or bottom of the stairs. If you do have throw rugs, attach them to the floor with carpet tape. Make sure that you have a light switch at the top of the stairs and the bottom of the stairs. If you do not have them, ask someone to add them for you. What else can I do to help prevent falls? Wear shoes that: Do not have high heels. Have rubber bottoms. Are comfortable and fit you well. Are closed at the toe. Do not wear sandals. If you use a stepladder: Make sure that it is fully opened. Do not climb a closed stepladder. Make sure that both sides of the stepladder  are locked into place. Ask someone to hold it for you, if possible. Clearly mark and make sure that you can see: Any grab bars or handrails. First and last steps. Where the edge of each step is. Use tools that help you move around (mobility aids) if they are needed. These include: Canes. Walkers. Scooters. Crutches. Turn on the lights when you go into a dark area. Replace any light bulbs as soon as they burn out. Set up your furniture so you have a clear path. Avoid moving your furniture around. If any of your floors are uneven, fix them. If there are any pets around you, be aware of where they are. Review your medicines with your doctor. Some medicines can make you feel dizzy. This can increase your chance of falling. Ask your doctor what other things that you can do to help prevent falls. This information is not intended to replace advice given to you by your health care provider. Make sure you discuss any questions you have with your health care provider. Document Released: 09/27/2009 Document Revised: 05/08/2016 Document Reviewed: 01/05/2015 Elsevier Interactive Patient Education  2017 [employer].

## 2021-10-22 NOTE — Progress Notes (Addendum)
Subjective:   Ralph [name] is a 70 y.o. male who presents for an Initial Medicare Annual Wellness Visit.  I connected with [place] today by telephone and verified that I am speaking with the correct person using two identifiers. Location patient: home Location provider: work Persons participating in the virtual visit: patient, Marine scientist.    I discussed the limitations, risks, security and privacy concerns of performing an evaluation and management service by telephone and the availability of in person appointments. I also discussed with the patient that there may be a patient responsible charge related to this service. The patient expressed understanding and verbally consented to this telephonic visit.    Interactive audio and video telecommunications were attempted between this provider and patient, however failed, due to patient having technical difficulties OR patient did not have access to video capability.  We continued and completed visit with audio only.  Some vital signs may be absent or patient reported.   Time Spent with patient on telephone encounter: 20 minutes   Review of Systems     Cardiac Risk Factors include: advanced age (>76men, >72 women);male gender;dyslipidemia;hypertension     Objective:    Today's Vitals   10/22/21 0900  Weight: 188 lb (85.3 kg)  Height: 5\' 9"  (1.753 m)   Body mass index is 27.76 kg/m.  Advanced Directives 10/22/2021 10/04/2018 05/31/2018 05/28/2018  Does Patient Have a Medical Advance Directive? No No No No  Does patient want to make changes to medical advance directive? - No - Patient declined - -  Would patient like information on creating a medical advance directive? No - Patient declined No - Patient declined No - Patient declined No - Patient declined    Current Medications (verified) Outpatient Encounter Medications as of 10/22/2021  Medication Sig   atorvastatin (LIPITOR) 40 MG tablet TAKE ONE-HALF TABLET BY  MOUTH DAILY    fluticasone (FLONASE) 50 MCG/ACT nasal spray Place 2 sprays into both nostrils daily as needed for allergies or rhinitis.   ketoconazole (NIZORAL) 2 % shampoo Apply 1 application topically 2 (two) times a week.   losartan (COZAAR) 100 MG tablet TAKE 1 TABLET BY MOUTH  DAILY   metoprolol succinate (TOPROL-XL) 50 MG 24 hr tablet Take 0.5 tablets (25 mg total) by mouth daily. Take with or immediately following a meal.   mometasone (ELOCON) 0.1 % cream Apply 1 application topically as directed. Apply thin layer to the affected areas twice daily for up to two weeks   No facility-administered encounter medications on file as of 10/22/2021.    Allergies (verified) Penicillins   History: Past Medical History:  Diagnosis Date   Basal cell carcinoma 05/07/2015   R mid back    Hyperlipidemia 2007   Hypertension 2007   Inguinal hernia bilateral, non-recurrent    Normal cardiac stress test 2008   per pt   Past Surgical History:  Procedure Laterality Date   INGUINAL HERNIA REPAIR Bilateral 05/31/2018   Procedure: LAPAROSCOPIC BILATERAL INGUINAL HERNIA REPAIR ERAS PATHWAY;  Surgeon: Coralie Keens, MD;  Location: [facility];  Service: General;  Laterality: Bilateral;   INGUINAL HERNIA REPAIR Left 10/07/2018   Procedure: [place];  Surgeon: Coralie Keens, MD;  Location: [place] [facility];  Service: General;  Laterality: Left;  TAP BLOCK   INSERTION OF MESH Bilateral 05/31/2018   Procedure: INSERTION OF MESH;  Surgeon: Coralie Keens, MD;  Location: [place] [facility];  Service: General;  Laterality: Bilateral;  INSERTION OF MESH Left 10/07/2018   Procedure: INSERTION OF MESH;  Surgeon: Coralie Keens, MD;  Location: [facility];  Service: General;  Laterality: Left;   Family History  Problem Relation Age of Onset   Breast cancer Mother    Bone cancer Mother    Coronary artery disease Father        w/ first MI age 49, GF MI in the early 24s    Hypertension Sister    Macular degeneration Maternal Aunt    Prostate cancer Neg Hx    Colon cancer Neg Hx    Diabetes Neg Hx    Social History   Socioeconomic History   Marital status: Divorced    Spouse name: Not on file   Number of children: 1   Years of education: Not on file   Highest education level: Not on file  Occupational History   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, hearing aid  Tobacco Use   Smoking status: Never   Smokeless tobacco: Never  Vaping Use   Vaping Use: Never used  Substance and Sexual Activity   Alcohol use: Yes    Comment: socially   Drug use: No   Sexual activity: Yes  Other Topics Concern   Not on file  Social History Narrative   Original from [place] w/  girlfriend part time    90 son ~ 2019 (OD)         Social Determinants of Health   Financial Resource Strain: Low Risk    Difficulty of Paying Living Expenses: Not hard at all  Food Insecurity: No Food Insecurity   Worried About Charity fundraiser in the Last Year: Never true   Arboriculturist in the Last Year: Never true  Transportation Needs: No Transportation Needs   Lack of Transportation (Medical): No   Lack of Transportation (Non-Medical): No  Physical Activity: Sufficiently Active   Days of Exercise per Week: 4 days   Minutes of Exercise per Session: 60 min  Stress: No Stress Concern Present   Feeling of Stress : Not at all  Social Connections: Socially Isolated   Frequency of Communication with Friends and Family: More than three times a week   Frequency of Social Gatherings with Friends and Family: More than three times a week   Attends Religious Services: Never   Marine scientist or Organizations: No   Attends Music therapist: Never   Marital Status: Divorced    Tobacco Counseling Counseling given: Not Answered   Clinical Intake:  Pre-visit preparation completed: Yes  Pain : No/denies pain     BMI - recorded: 27.76 Nutritional  Status: BMI 25 -29 Overweight Nutritional Risks: None Diabetes: No  How often do you need to have someone help you when you read instructions, pamphlets, or other written materials from your doctor or pharmacy?: 1 - Never  Diabetic?No  Interpreter Needed?: No  Information entered by :: Caroleen Hamman LPN   Activities of Daily Living In your present state of health, do you have any difficulty performing the following activities: 10/22/2021 01/09/2021  Hearing? N N  Vision? N N  Difficulty concentrating or making decisions? N N  Walking or climbing stairs? N N  Dressing or bathing? N N  Doing errands, shopping? N N  Preparing Food and eating ? N -  Using the Toilet? N -  In the past six months, have you accidently leaked urine? N -  Do you have problems  with loss of bowel control? N -  Managing your Medications? N -  Managing your Finances? N -  Housekeeping or managing your Housekeeping? N -  Some recent data might be hidden    Patient Care Team: Colon Branch, MD as PCP - General Wonda Horner, MD as Consulting Physician (Gastroenterology)  Indicate any recent Medical Services you may have received from other than [facility] providers in the past year (date may be approximate).     Assessment:   This is a routine wellness examination for Ralph [name].  Hearing/Vision screen Hearing Screening - Comments:: No issues Vision Screening - Comments:: Last eye exam-2 months ago-Dr. Gloriann Loan  Dietary issues and exercise activities discussed: Current Exercise Habits: Home exercise routine, Type of exercise: strength training/weights (cardio), Time (Minutes): 60, Frequency (Times/Week): 4, Weekly Exercise (Minutes/Week): 240, Intensity: Mild, Exercise limited by: None identified   Goals Addressed             This Visit's Progress    Patient Stated       Maintain current health       Depression Screen PHQ 2/9 Scores 10/22/2021 01/09/2021 11/04/2019 02/14/2019 04/14/2018 03/10/2016  PHQ - 2  Score 0 0 0 0 0 0  PHQ- 9 Score - - - 0 - -    Fall Risk Fall Risk  10/22/2021 01/09/2021 11/04/2019 04/14/2018 03/10/2016  Falls in the past year? 0 0 0 No No  Number falls in past yr: 0 0 - - -  Injury with Fall? 0 0 - - -  Follow up Falls prevention discussed Falls evaluation completed Falls evaluation completed - -    FALL RISK PREVENTION PERTAINING TO THE HOME:  Any stairs in or around the home? Yes  If so, are there any without handrails? No  Home free of loose throw rugs in walkways, pet beds, electrical cords, etc? Yes  Adequate lighting in your home to reduce risk of falls? Yes   ASSISTIVE DEVICES UTILIZED TO PREVENT FALLS:  Life alert? No  Use of a cane, walker or w/c? No  Grab bars in the bathroom? No  Shower chair or bench in shower? No  Elevated toilet seat or a handicapped toilet? No   TIMED UP AND GO:  Was the test performed? No . Phone visit   Cognitive Function:Normal cognitive status assessed by  this Nurse Health Advisor. No abnormalities found.          Immunizations Immunization History  Administered Date(s) Administered   Fluad Quad(high Dose 65+) 11/04/2019, 01/09/2021   Influenza,inj,Quad PF,6+ Mos 10/25/2014   Influenza-Unspecified 10/06/2017, 10/24/2018   PFIZER(Purple Top)SARS-COV-2 Vaccination 01/25/2020, 02/15/2020   Pneumococcal Conjugate-13 11/04/2019   Pneumococcal Polysaccharide-23 01/09/2021   Tdap 04/15/2011   Zoster, Live 03/07/2013    TDAP status: Due, Education has been provided regarding the importance of this vaccine. Advised may receive this vaccine at local pharmacy or Health Dept. Aware to provide a copy of the vaccination record if obtained from local pharmacy or Health Dept. Verbalized acceptance and understanding.  Flu Vaccine status: Due, Education has been provided regarding the importance of this vaccine. Advised may receive this vaccine at local pharmacy or Health Dept. Aware to provide a copy of the vaccination record  if obtained from local pharmacy or Health Dept. Verbalized acceptance and understanding.  Pneumococcal vaccine status: Up to date  Covid-19 vaccine status: Information provided on how to obtain vaccines.   Qualifies for Shingles Vaccine? Yes   Zostavax completed Yes   Shingrix  Completed?: No.    Education has been provided regarding the importance of this vaccine. Patient has been advised to call insurance company to determine out of pocket expense if they have not yet received this vaccine. Advised may also receive vaccine at local pharmacy or Health Dept. Verbalized acceptance and understanding.  Screening Tests Health Maintenance  Topic Date Due   Zoster Vaccines- Shingrix (1 of 2) Never done   COVID-19 Vaccine (3 - Pfizer risk series) 03/14/2020   TETANUS/TDAP  04/14/2021   INFLUENZA VACCINE  07/15/2021   COLONOSCOPY (Pts 45-52yrs Insurance coverage will need to be confirmed)  05/21/2028   Pneumonia Vaccine 59+ Years old  Completed   Hepatitis C Screening  Completed   HPV VACCINES  Aged Out    Health Maintenance  Health Maintenance Due  Topic Date Due   Zoster Vaccines- Shingrix (1 of 2) Never done   COVID-19 Vaccine (3 - Pfizer risk series) 03/14/2020   TETANUS/TDAP  04/14/2021   INFLUENZA VACCINE  07/15/2021    Colorectal cancer screening: Type of screening: Colonoscopy. Completed 05/21/2018. Repeat every 10 years  Lung Cancer Screening: (Low Dose CT Chest recommended if Age 32-80 years, 30 pack-year currently smoking OR have quit w/in 15years.) does not qualify.     Additional Screening:  Hepatitis C Screening: Completed 03/17/2017  Vision Screening: Recommended annual ophthalmology exams for early detection of glaucoma and other disorders of the eye. Is the patient up to date with their annual eye exam?  Yes  Who is the provider or what is the name of the office in which the patient attends annual eye exams? Dr. Gloriann Loan   Dental Screening: Recommended annual dental  exams for proper oral hygiene  Community Resource Referral / Chronic Care Management: CRR required this visit?  No   CCM required this visit?  No      Plan:     I have personally reviewed and noted the following in the patient's chart:   Medical and social history Use of alcohol, tobacco or illicit drugs  Current medications and supplements including opioid prescriptions. Patient is not currently taking opioid prescriptions. Functional ability and status Nutritional status Physical activity Advanced directives List of other physicians Hospitalizations, surgeries, and ER visits in previous 12 months Vitals Screenings to include cognitive, depression, and falls Referrals and appointments  In addition, I have reviewed and discussed with patient certain preventive protocols, quality metrics, and best practice recommendations. A written personalized care plan for preventive services as well as general preventive health recommendations were provided to patient.   Due to this being a telephonic visit, the after visit summary with patients personalized plan was offered to patient via mail or my-chart.  Patient would like to access on my-chart.   Marta Antu, LPN   [id number]  Nurse Health Advisor  Nurse Notes: None  I have reviewed and agree with Health Coaches documentation.  Kathlene November, MD

## 2021-10-24 DIAGNOSIS — I872 Venous insufficiency (chronic) (peripheral): Secondary | ICD-10-CM | POA: Diagnosis not present

## 2021-10-24 DIAGNOSIS — I83891 Varicose veins of right lower extremities with other complications: Secondary | ICD-10-CM | POA: Diagnosis not present

## 2021-10-28 DIAGNOSIS — I83811 Varicose veins of right lower extremities with pain: Secondary | ICD-10-CM | POA: Diagnosis not present

## 2021-10-28 DIAGNOSIS — Z09 Encounter for follow-up examination after completed treatment for conditions other than malignant neoplasm: Secondary | ICD-10-CM | POA: Diagnosis not present

## 2021-11-19 DIAGNOSIS — I87391 Chronic venous hypertension (idiopathic) with other complications of right lower extremity: Secondary | ICD-10-CM | POA: Diagnosis not present

## 2021-11-19 DIAGNOSIS — I83891 Varicose veins of right lower extremities with other complications: Secondary | ICD-10-CM | POA: Diagnosis not present

## 2021-11-19 DIAGNOSIS — I87321 Chronic venous hypertension (idiopathic) with inflammation of right lower extremity: Secondary | ICD-10-CM | POA: Diagnosis not present

## 2021-11-20 ENCOUNTER — Telehealth: Payer: Self-pay

## 2021-11-20 NOTE — Telephone Encounter (Signed)
Refill of rosacea medication sent into [facility].

## 2021-11-21 NOTE — Progress Notes (Signed)
[facility] Note  11/25/2021     CHIEF COMPLAINT Patient presents for Retina Follow Up   HISTORY OF PRESENT ILLNESS: Ralph [name] is a 70 y.o. male who presents to the clinic today for:   HPI     Retina Follow Up   Patient presents with  Other.  In right eye.  Severity is moderate.  Duration of 5 weeks.  Since onset it is stable.  I, the attending physician,  performed the HPI with the patient and updated documentation appropriately.        Comments   Pt here for 5 wk ret f/u for PVD OD. Pt reports vision is still the same. Does report still seeing flashes and floater OD, but is not as severe as before.       Last edited by Bernarda Caffey, MD on 11/25/2021 12:26 PM.    Pt states floaters are still there, but better, he only sees fol at night or when it's dark  Referring physician: Lorelee Cover., MD [place],  [place] [address]  HISTORICAL INFORMATION:   Selected notes from the [facility].  Self-referral (referred by Delila Spence) for eval of sudden onset floaters OD.   CURRENT MEDICATIONS: No current outpatient medications on file. (Ophthalmic Drugs)   No current facility-administered medications for this visit. (Ophthalmic Drugs)   Current Outpatient Medications (Other)  Medication Sig   atorvastatin (LIPITOR) 40 MG tablet TAKE ONE-HALF TABLET BY  MOUTH DAILY   fluticasone (FLONASE) 50 MCG/ACT nasal spray Place 2 sprays into both nostrils daily as needed for allergies or rhinitis.   ketoconazole (NIZORAL) 2 % shampoo Apply 1 application topically 2 (two) times a week.   losartan (COZAAR) 100 MG tablet TAKE 1 TABLET BY MOUTH  DAILY   metoprolol succinate (TOPROL-XL) 50 MG 24 hr tablet Take 0.5 tablets (25 mg total) by mouth daily. Take with or immediately following a meal.   mometasone (ELOCON) 0.1 % cream Apply 1 application topically as directed. Apply thin layer to the affected areas twice daily for  up to two weeks   No current facility-administered medications for this visit. (Other)   REVIEW OF SYSTEMS: ROS   Positive for: HENT, Eyes Negative for: Constitutional, Gastrointestinal, Neurological, Skin, Genitourinary, Musculoskeletal, Endocrine, Cardiovascular, Respiratory, Psychiatric, Allergic/Imm, Heme/Lymph Last edited by Kingsley Spittle, COT on 11/25/2021  9:14 AM.     ALLERGIES Allergies  Allergen Reactions   Penicillins     UNSPECIFIED CHILDHOOD REACTION  Has patient had a PCN reaction causing immediate rash, facial/tongue/throat swelling, SOB or lightheadedness with hypotension: Unknown Has patient had a PCN reaction causing severe rash involving mucus membranes or skin necrosis: Unknown Has patient had a PCN reaction that required hospitalization: No Has patient had a PCN reaction occurring within the last 10 years: No If all of the above answers are "NO", then may proceed with Cephalosporin use.     PAST MEDICAL HISTORY Past Medical History:  Diagnosis Date   Basal cell carcinoma 05/07/2015   R mid back    Hyperlipidemia 2007   Hypertension 2007   Inguinal hernia bilateral, non-recurrent    Normal cardiac stress test 2008   per pt   Past Surgical History:  Procedure Laterality Date   INGUINAL HERNIA REPAIR Bilateral 05/31/2018   Procedure: LAPAROSCOPIC BILATERAL INGUINAL HERNIA REPAIR ERAS PATHWAY;  Surgeon: Coralie Keens, MD;  Location: [facility];  Service: General;  Laterality: Bilateral;  INGUINAL HERNIA REPAIR Left 10/07/2018   Procedure: OPEN LEFT INGUINAL HERNIA REPAIR ERAS PATHWAY;  Surgeon: Coralie Keens, MD;  Location: [facility];  Service: General;  Laterality: Left;  TAP BLOCK   INSERTION OF MESH Bilateral 05/31/2018   Procedure: INSERTION OF MESH;  Surgeon: Coralie Keens, MD;  Location: [facility];  Service: General;  Laterality: Bilateral;   INSERTION OF MESH Left 10/07/2018   Procedure: INSERTION OF MESH;   Surgeon: Coralie Keens, MD;  Location: [facility];  Service: General;  Laterality: Left;   FAMILY HISTORY Family History  Problem Relation Age of Onset   Breast cancer Mother    Bone cancer Mother    Coronary artery disease Father        w/ first MI age 72, GF MI in the early 49s   Hypertension Sister    Macular degeneration Maternal Aunt    Prostate cancer Neg Hx    Colon cancer Neg Hx    Diabetes Neg Hx    SOCIAL HISTORY Social History   Tobacco Use   Smoking status: Never   Smokeless tobacco: Never  Vaping Use   Vaping Use: Never used  Substance Use Topics   Alcohol use: Yes    Comment: socially   Drug use: No       OPHTHALMIC EXAM: Base Eye Exam     Visual Acuity (Snellen - Linear)       Right Left   Dist [place] 20/25 +2 20/30   Dist ph [place] 20/20 20/20 -2         Tonometry (Tonopen, 9:20 AM)       Right Left   Pressure 23 22         Pupils       Dark Light Shape React APD   Right 3 2 Round Brisk None   Left 3 2 Round Brisk None         Visual Fields (Counting fingers)       Left Right    Full Full         Extraocular Movement       Right Left    Full, Ortho Full, Ortho         Neuro/Psych     Oriented x3: Yes   Mood/Affect: Normal         Dilation     Both eyes: 1.0% Mydriacyl, 2.5% Phenylephrine @ 9:22 AM           Slit Lamp and Fundus Exam     Slit Lamp Exam       Right Left   Lids/Lashes Dermatochalasis - upper lid Dermatochalasis - upper lid   Conjunctiva/Sclera White and quiet White and quiet   Cornea arcus, trace PEE arcus, 1+ Punctate epithelial erosions   Anterior Chamber Deep and quiet Deep and quiet   Iris Round and dilated, PPM IN Round and dilated, PPM IN   Lens 2+ Nuclear sclerosis, 2+ Cortical cataract 2+ Nuclear sclerosis, 2+ Cortical cataract   Anterior Vitreous Vitreous syneresis, no pigment, low Posterior vitreous detachment, Weiss ring, vitreous condensations still overlying disc clear          Fundus Exam       Right Left   Disc Pink and Sharp Pink and Sharp   C/D Ratio 0.4 0.5   Macula Flat, Good foveal reflex, RPE mottling, No heme or edema Flat, Good foveal reflex, RPE mottling, No heme or edema   Vessels mild attenuation attenuated  Periphery Attached Attached              IMAGING AND PROCEDURES  Imaging and Procedures for 11/25/2021  OCT, Retina - OU - Both Eyes       Right Eye Quality was good. Central Foveal Thickness: 296. Progression has been stable. Findings include normal foveal contour, no IRF, no SRF.   Left Eye Quality was good. Central Foveal Thickness: 297. Progression has been stable. Findings include normal foveal contour, no IRF, no SRF, vitreomacular adhesion .   Notes *Images captured and stored on drive  Diagnosis / Impression:  NFP, no IRF/SRF OU  Clinical management:  See below  Abbreviations: NFP - Normal foveal profile. CME - cystoid macular edema. PED - pigment epithelial detachment. IRF - intraretinal fluid. SRF - subretinal fluid. EZ - ellipsoid zone. ERM - epiretinal membrane. ORA - outer retinal atrophy. ORT - outer retinal tubulation. SRHM - subretinal hyper-reflective material. IRHM - intraretinal hyper-reflective material            ASSESSMENT/PLAN:    ICD-10-CM   1. Posterior vitreous detachment of right eye  H43.811     2. Retinal edema  H35.81 OCT, Retina - OU - Both Eyes    3. Essential hypertension  I10     4. Hypertensive retinopathy of both eyes  H35.033     5. Combined forms of age-related cataract of both eyes  H25.813       1,2. PVD / vitreous syneresis OD  - acute symptomatic flashes/floaters on Saturday, 11.5.32 while golfing  - today, symptoms improved  - Discussed findings and prognosis  - No RT or RD on 360 exam  - Reviewed s/s of RT/RD  - Strict return precautions for any such RT/RD signs/symptoms  - f/u PRN  3,4. Hypertensive retinopathy OU - discussed importance of tight BP  control - monitor  5. Mixed Cataract OU - The symptoms of cataract, surgical options, and treatments and risks were discussed with patient. - discussed diagnosis and progression - not yet visually significant - monitor for now  Ophthalmic Meds Ordered this visit:  No orders of the defined types were placed in this encounter.    Return if symptoms worsen or fail to improve.  There are no Patient Instructions on file for this visit.  Explained the diagnoses, plan, and follow up with the patient and they expressed understanding.  Patient expressed understanding of the importance of proper follow up care.   This document serves as a record of services personally performed by Gardiner Sleeper, MD, PhD. It was created on their behalf by Roselee Nova, COMT. The creation of this record is the provider's dictation and/or activities during the visit.  Electronically signed by: Roselee Nova, COMT 11/25/21 12:32 PM  This document serves as a record of services personally performed by Gardiner Sleeper, MD, PhD. It was created on their behalf by San Jetty. Owens Shark, OA an ophthalmic technician. The creation of this record is the provider's dictation and/or activities during the visit.    Electronically signed by: San Jetty. Marguerita Merles 12.12.2022 12:32 PM  Gardiner Sleeper, M.D., Ph.D. Diseases & Surgery of the Retina and Vitreous [facility]  I have reviewed the above documentation for accuracy and completeness, and I agree with the above. Gardiner Sleeper, M.D., Ph.D. 11/25/21 12:32 PM  Abbreviations: M myopia (nearsighted); A astigmatism; H hyperopia (farsighted); P presbyopia; Mrx spectacle prescription;  CTL contact lenses; OD right eye; OS left  eye; OU both eyes  XT exotropia; ET esotropia; PEK punctate epithelial keratitis; PEE punctate epithelial erosions; DES dry eye syndrome; MGD meibomian gland dysfunction; ATs artificial tears; PFAT's preservative free artificial tears;  [place] nuclear sclerotic cataract; PSC posterior subcapsular cataract; ERM epi-retinal membrane; PVD posterior vitreous detachment; RD retinal detachment; DM diabetes mellitus; DR diabetic retinopathy; NPDR non-proliferative diabetic retinopathy; PDR proliferative diabetic retinopathy; CSME clinically significant macular edema; DME diabetic macular edema; dbh dot blot hemorrhages; CWS cotton wool spot; POAG primary open angle glaucoma; C/D cup-to-disc ratio; HVF humphrey visual field; GVF goldmann visual field; OCT optical coherence tomography; IOP intraocular pressure; BRVO Branch retinal vein occlusion; CRVO central retinal vein occlusion; CRAO central retinal artery occlusion; BRAO branch retinal artery occlusion; RT retinal tear; SB scleral buckle; PPV pars plana vitrectomy; VH Vitreous hemorrhage; PRP panretinal laser photocoagulation; IVK intravitreal kenalog; VMT vitreomacular traction; MH Macular hole;  NVD neovascularization of the disc; NVE neovascularization elsewhere; AREDS age related eye disease study; ARMD age related macular degeneration; POAG primary open angle glaucoma; EBMD epithelial/anterior basement membrane dystrophy; ACIOL anterior chamber intraocular lens; IOL intraocular lens; PCIOL posterior chamber intraocular lens; Phaco/IOL phacoemulsification with intraocular lens placement; [place] photorefractive keratectomy; LASIK laser assisted in situ keratomileusis; HTN hypertension; DM diabetes mellitus; COPD chronic obstructive pulmonary disease

## 2021-11-25 ENCOUNTER — Other Ambulatory Visit: Payer: Self-pay

## 2021-11-25 ENCOUNTER — Encounter (INDEPENDENT_AMBULATORY_CARE_PROVIDER_SITE_OTHER): Payer: Self-pay | Admitting: Ophthalmology

## 2021-11-25 ENCOUNTER — Ambulatory Visit (INDEPENDENT_AMBULATORY_CARE_PROVIDER_SITE_OTHER): Payer: Medicare Other | Admitting: Ophthalmology

## 2021-11-25 DIAGNOSIS — H43811 Vitreous degeneration, right eye: Secondary | ICD-10-CM | POA: Diagnosis not present

## 2021-11-25 DIAGNOSIS — H35033 Hypertensive retinopathy, bilateral: Secondary | ICD-10-CM

## 2021-11-25 DIAGNOSIS — I10 Essential (primary) hypertension: Secondary | ICD-10-CM | POA: Diagnosis not present

## 2021-11-25 DIAGNOSIS — H3581 Retinal edema: Secondary | ICD-10-CM

## 2021-11-25 DIAGNOSIS — H25813 Combined forms of age-related cataract, bilateral: Secondary | ICD-10-CM | POA: Diagnosis not present

## 2021-12-04 DIAGNOSIS — I83891 Varicose veins of right lower extremities with other complications: Secondary | ICD-10-CM | POA: Diagnosis not present

## 2021-12-04 DIAGNOSIS — I83811 Varicose veins of right lower extremities with pain: Secondary | ICD-10-CM | POA: Diagnosis not present

## 2021-12-04 DIAGNOSIS — M7989 Other specified soft tissue disorders: Secondary | ICD-10-CM | POA: Diagnosis not present

## 2021-12-17 ENCOUNTER — Other Ambulatory Visit: Payer: Self-pay | Admitting: Internal Medicine

## 2022-01-07 ENCOUNTER — Telehealth: Payer: Self-pay | Admitting: Internal Medicine

## 2022-01-07 NOTE — Telephone Encounter (Signed)
Pt stated his bp has been elevated recently. The last time he saw Dr. Larose Kells he was supposed to take 1 losartan a day and half metoprolol a day. Over the summer his bp got low so he started taking half of each a day.  Now he has a head cold and feel that is why his bp is elevated. Sunday he took recommended amount of bp medication and his bp is still ranging from 176/91-160. Scheduled him for tomorrow, but as he was very concerned transferred him to triage for a nurse to assess if he needs to seek immediate care. Please advise.

## 2022-01-07 NOTE — Telephone Encounter (Signed)
Nurse Assessment Nurse: Ysidro Evert, RN, Levada Dy Date/Time Eilene Ghazi Time): 01/07/2022 1:01:41 PM Confirm and document reason for call. If symptomatic, describe symptoms. ---Caller states his blood pressure is running high. 176/91 current reading Does the patient have any new or worsening symptoms? ---Yes Will a triage be completed? ---Yes Related visit to physician within the last 2 weeks? ---No Does the PT have any chronic conditions? (i.e. diabetes, asthma, this includes High risk factors for pregnancy, etc.) ---Yes List chronic conditions. ---hypertension Is this a behavioral health or substance abuse call? ---No Guidelines Guideline Title Affirmed Question Affirmed Notes Nurse Date/Time (Eastern Time) Blood Pressure - High Systolic BP >= 154 OR Diastolic >= 008 Ysidro Evert, RN, Levada Dy 01/07/2022 1:03:09 PM Disp. Time Eilene Ghazi Time) Disposition Final User 01/07/2022 1:09:03 PM SEE PCP WITHIN 3 DAYS Yes Ysidro Evert, RN, Marin Shutter Disagree/Comply Comply Caller Understands Yes PLEASE NOTE: All timestamps contained within this report are represented as [place] Standard Time. CONFIDENTIALTY NOTICE: This fax transmission is intended only for the addressee. It contains information that is legally privileged, confidential or otherwise protected from use or disclosure. If you are not the intended recipient, you are strictly prohibited from reviewing, disclosing, copying using or disseminating any of this information or taking any action in reliance on or regarding this information. If you have received this fax in error, please notify us immediately by telephone so that we can arrange for its return to [place]. Phone: [phone], Toll-Free: [phone], Fax: [phone] Page: 2 of 2 Call Id: [id number] PreDisposition Did not know what to do Care Advice Given Per Guideline SEE PCP WITHIN 3 DAYS: * You need to be seen within 2 or 3 days. CALL BACK IF: * Weakness or numbness of the face, arm or leg on  one side of the body occurs * Difficulty walking, difficulty talking, or severe headache occurs * Chest pain or difficulty breathing occurs * Your blood pressure is over 180/110 * You become worse CARE ADVICE given per High Blood Pressure (Adult) guideline. Referrals REFERRED TO PCP OFFICE  Appt scheduled with PCP tomorrow.

## 2022-01-08 ENCOUNTER — Ambulatory Visit (INDEPENDENT_AMBULATORY_CARE_PROVIDER_SITE_OTHER): Payer: Medicare HMO | Admitting: Internal Medicine

## 2022-01-08 ENCOUNTER — Encounter: Payer: Self-pay | Admitting: Internal Medicine

## 2022-01-08 VITALS — BP 164/84 | HR 71 | Temp 98.0°F | Resp 16 | Ht 69.0 in | Wt 191.4 lb

## 2022-01-08 DIAGNOSIS — Z8249 Family history of ischemic heart disease and other diseases of the circulatory system: Secondary | ICD-10-CM | POA: Diagnosis not present

## 2022-01-08 DIAGNOSIS — I10 Essential (primary) hypertension: Secondary | ICD-10-CM | POA: Diagnosis not present

## 2022-01-08 MED ORDER — AMLODIPINE BESYLATE 5 MG PO TABS: 5.0000 mg | ORAL_TABLET | Freq: Every day | ORAL | 6 refills | Status: DC

## 2022-01-08 MED ORDER — ASPIRIN 81 MG PO TBEC: 81.0000 mg | DELAYED_RELEASE_TABLET | Freq: Every day | ORAL | Status: AC

## 2022-01-08 NOTE — Progress Notes (Signed)
Subjective:    Patient ID: Ralph [name], male    DOB: 1951/01/01, 71 y.o.   MRN: [id number]  DOS:  01/08/2022 Type of visit - description: Acute visit  Patient is concerned about his blood pressure. Routine meds are:  losartan 100 mg and metoprolol half tablet. During the summer of  2022, BP was running in the low side so he cut down losartan to 50 mg daily. His BP was well controlled until early January when he noted BPs were going from the 150s to the 180s.  He denies chest pain or difficulty breathing. No edema. No NSAIDs. No dizziness. He has a mild headache now because he is having a head cold.  Not taking any decongestants. For the last 3 days went back to losartan full tablet but BP still slightly high.   Review of Systems See above   Past Medical History:  Diagnosis Date   Basal cell carcinoma 05/07/2015   R mid back    Hyperlipidemia 2007   Hypertension 2007   Inguinal hernia bilateral, non-recurrent    Normal cardiac stress test 2008   per pt    Past Surgical History:  Procedure Laterality Date   INGUINAL HERNIA REPAIR Bilateral 05/31/2018   Procedure: LAPAROSCOPIC BILATERAL INGUINAL HERNIA REPAIR ERAS PATHWAY;  Surgeon: Coralie Keens, MD;  Location: [place];  Service: General;  Laterality: Bilateral;   INGUINAL HERNIA REPAIR Left 10/07/2018   Procedure: OPEN LEFT INGUINAL HERNIA REPAIR ERAS PATHWAY;  Surgeon: Coralie Keens, MD;  Location: [place];  Service: General;  Laterality: Left;  TAP BLOCK   INSERTION OF MESH Bilateral 05/31/2018   Procedure: INSERTION OF MESH;  Surgeon: Coralie Keens, MD;  Location: [place];  Service: General;  Laterality: Bilateral;   INSERTION OF MESH Left 10/07/2018   Procedure: INSERTION OF MESH;  Surgeon: Coralie Keens, MD;  Location: [place];  Service: General;  Laterality: Left;    Current Outpatient Medications  Medication Instructions   atorvastatin (LIPITOR) 40 MG tablet TAKE  ONE-HALF TABLET BY  MOUTH DAILY   fluticasone (FLONASE) 50 MCG/ACT nasal spray 2 sprays, Each Nare, Daily PRN   ketoconazole (NIZORAL) 2 % shampoo 1 application, Topical, 2 times weekly   losartan (COZAAR) 100 MG tablet TAKE 1 TABLET BY MOUTH  DAILY   metoprolol succinate (TOPROL-XL) 25 mg, Oral, Daily, Take with or immediately following a meal.   mometasone (ELOCON) 0.1 % cream 1 application, Topical, As directed, Apply thin layer to the affected areas twice daily for up to two weeks       Objective:   Physical Exam BP (!) 164/84 (BP Location: Left Arm, Patient Position: Sitting, Cuff Size: Small)    Pulse 71    Temp 98 F (36.7 C) (Oral)    Resp 16    Ht 5\' 9"  (1.753 m)    Wt 191 lb 6 oz (86.8 kg)    SpO2 98%    BMI 28.26 kg/m  General:   Well developed, NAD, BMI noted.  HEENT:  Normocephalic . Face symmetric, atraumatic Lungs:  CTA B Normal respiratory effort, no intercostal retractions, no accessory muscle use. Heart: RRR,  no murmur.  Abdomen:  Not distended, soft, non-tender. No rebound or rigidity.   Skin: Not pale. Not jaundice Lower extremities: no pretibial edema bilaterally  Neurologic:  alert & oriented X3.  Speech normal, gait appropriate for age and unassisted Psych--  Cognition and judgment appear intact.  Cooperative with normal attention span  and concentration.  Behavior appropriate. No anxious or depressed appearing.     Assessment    Assessment HTN Hyperlipidemia Tinnitus (used to take xanas very rarely) E.D. Posterior speech is detaching R   + FH CAD   PLAN HTN: Used to be well controlled on losartan 100 mg and metoprolol XL 50 half tablet daily. During the summer BP was so good he cut down losartan to 50 mg, BP okay until about 3 weeks ago when it started running high.  No obvious triggers for elevated BP, see review of systems. EKG today: Sinus rhythm, heart rate 62 Plan: CMP, CBC Continue losartan 100 daily and Toprol-XL 50 mg 1 tablet  daily. Add amlodipine 5 mg, monitor BPs. Low-salt diet  FH CAD: Patient concerned about this issue.  Has not been taking aspirin. Plan: Restart aspirin 81 mg, order calcium coronary score.  (If completely negative consider stop aspirin) Preventive care: Declines flu shot, recommend COVID-vaccine. RTC CPX 1 month     This visit occurred during the SARS-CoV-2 public health emergency.  Safety protocols were in place, including screening questions prior to the visit, additional usage of staff PPE, and extensive cleaning of exam room while observing appropriate contact time as indicated for disinfecting solutions.

## 2022-01-08 NOTE — Patient Instructions (Addendum)
Continue losartan, metoprolol. Add amlodipine 5 mg at bedtime  Watch salt intake    Check the  blood pressure regularly BP GOAL is between 110/65 and  135/85. If it is consistently higher or lower, let me know   Recommend to proceed with a COVID booster  GO TO THE LAB : Get the blood work     [place], [place] back for a physical exam in 1 month

## 2022-01-09 ENCOUNTER — Telehealth: Payer: Self-pay

## 2022-01-09 ENCOUNTER — Encounter: Payer: Self-pay | Admitting: Internal Medicine

## 2022-01-09 DIAGNOSIS — Z0000 Encounter for general adult medical examination without abnormal findings: Secondary | ICD-10-CM

## 2022-01-09 DIAGNOSIS — E785 Hyperlipidemia, unspecified: Secondary | ICD-10-CM

## 2022-01-09 DIAGNOSIS — Z8249 Family history of ischemic heart disease and other diseases of the circulatory system: Secondary | ICD-10-CM | POA: Insufficient documentation

## 2022-01-09 LAB — CBC WITH DIFFERENTIAL/PLATELET
Basophils Absolute: 0.1 10*3/uL (ref 0.0–0.1)
Basophils Relative: 0.8 % (ref 0.0–3.0)
Eosinophils Absolute: 0.2 10*3/uL (ref 0.0–0.7)
Eosinophils Relative: 3.1 % (ref 0.0–5.0)
HCT: 42.8 % (ref 39.0–52.0)
Hemoglobin: 14.3 g/dL (ref 13.0–17.0)
Lymphocytes Relative: 28.7 % (ref 12.0–46.0)
Lymphs Abs: 2 10*3/uL (ref 0.7–4.0)
MCHC: 33.3 g/dL (ref 30.0–36.0)
MCV: 90.3 fl (ref 78.0–100.0)
Monocytes Absolute: 0.6 10*3/uL (ref 0.1–1.0)
Monocytes Relative: 8.9 % (ref 3.0–12.0)
Neutro Abs: 4.1 10*3/uL (ref 1.4–7.7)
Neutrophils Relative %: 58.5 % (ref 43.0–77.0)
Platelets: 225 10*3/uL (ref 150.0–400.0)
RBC: 4.74 Mil/uL (ref 4.22–5.81)
RDW: 12.6 % (ref 11.5–15.5)
WBC: 7 10*3/uL (ref 4.0–10.5)

## 2022-01-09 LAB — COMPREHENSIVE METABOLIC PANEL
ALT: 18 U/L (ref 0–53)
AST: 23 U/L (ref 0–37)
Albumin: 4.4 g/dL (ref 3.5–5.2)
Alkaline Phosphatase: 58 U/L (ref 39–117)
BUN: 18 mg/dL (ref 6–23)
CO2: 28 mEq/L (ref 19–32)
Calcium: 9.8 mg/dL (ref 8.4–10.5)
Chloride: 103 mEq/L (ref 96–112)
Creatinine, Ser: 1.07 mg/dL (ref 0.40–1.50)
GFR: 70.72 mL/min (ref >60.00)
Glucose, Bld: 94 mg/dL (ref 70–99)
Potassium: 5 mEq/L (ref 3.5–5.1)
Sodium: 140 mEq/L (ref 135–145)
Total Bilirubin: 0.8 mg/dL (ref 0.2–1.2)
Total Protein: 8.1 g/dL (ref 6.0–8.3)

## 2022-01-09 NOTE — Assessment & Plan Note (Signed)
HTN: Used to be well controlled on losartan 100 mg and metoprolol XL 50 half tablet daily. During the summer BP was so good he cut down losartan to 50 mg, BP okay until about 3 weeks ago when it started running high.  No obvious triggers for elevated BP, see review of systems. EKG today: Sinus rhythm, heart rate 62 Plan: CMP, CBC Continue losartan 100 daily and Toprol-XL 50 mg 1 tablet daily. Add amlodipine 5 mg, monitor BPs. Low-salt diet  FH CAD: Patient concerned about this issue.  Has not been taking aspirin. Plan: Restart aspirin 81 mg, order calcium coronary score.  (If completely negative consider stop aspirin) Preventive care: Declines flu shot, recommend COVID-vaccine. RTC CPX 1 month

## 2022-01-09 NOTE — Telephone Encounter (Signed)
Pt called office stating he did call his insurance company and it wouldn't be a charge for him to get his labs prior his CPE on 01/29/22 , pt also wants his PSA checked along with the other labs

## 2022-01-10 NOTE — Addendum Note (Signed)
Addended byDamita Dunnings D on: 01/10/2022 03:10 PM   Modules accepted: Orders

## 2022-01-10 NOTE — Telephone Encounter (Signed)
Spoke w/ Pt- he will go to [facility]. Orders placed.

## 2022-01-10 NOTE — Telephone Encounter (Signed)
Labs that he needs before his physical: BMP, FLP, TSH, PSA

## 2022-01-15 ENCOUNTER — Ambulatory Visit (HOSPITAL_BASED_OUTPATIENT_CLINIC_OR_DEPARTMENT_OTHER): Payer: Medicare HMO

## 2022-01-16 ENCOUNTER — Ambulatory Visit (HOSPITAL_BASED_OUTPATIENT_CLINIC_OR_DEPARTMENT_OTHER)
Admission: RE | Admit: 2022-01-16 | Discharge: 2022-01-16 | Disposition: A | Discharge status: Home / Self Care | Payer: Medicare HMO | Source: Ambulatory Visit | Attending: Internal Medicine | Admitting: Internal Medicine

## 2022-01-16 ENCOUNTER — Encounter (HOSPITAL_BASED_OUTPATIENT_CLINIC_OR_DEPARTMENT_OTHER): Payer: Self-pay

## 2022-01-16 ENCOUNTER — Other Ambulatory Visit: Payer: Self-pay

## 2022-01-16 ENCOUNTER — Telehealth: Payer: Self-pay | Admitting: Internal Medicine

## 2022-01-16 DIAGNOSIS — I10 Essential (primary) hypertension: Secondary | ICD-10-CM | POA: Insufficient documentation

## 2022-01-16 DIAGNOSIS — Z8249 Family history of ischemic heart disease and other diseases of the circulatory system: Secondary | ICD-10-CM | POA: Insufficient documentation

## 2022-01-16 NOTE — Telephone Encounter (Signed)
Prescription sent

## 2022-01-16 NOTE — Telephone Encounter (Signed)
Refill request for atorvastatin 40mg . Pt's coronary calcium score is back. Please advise.

## 2022-01-23 ENCOUNTER — Other Ambulatory Visit (INDEPENDENT_AMBULATORY_CARE_PROVIDER_SITE_OTHER): Payer: Medicare HMO

## 2022-01-23 DIAGNOSIS — E785 Hyperlipidemia, unspecified: Secondary | ICD-10-CM | POA: Diagnosis not present

## 2022-01-23 DIAGNOSIS — Z0000 Encounter for general adult medical examination without abnormal findings: Secondary | ICD-10-CM | POA: Diagnosis not present

## 2022-01-23 LAB — LIPID PANEL
Cholesterol: 156 mg/dL (ref 0–200)
HDL: 55 mg/dL (ref >39.00)
LDL Cholesterol: 88 mg/dL (ref 0–99)
NonHDL: 100.96
Total CHOL/HDL Ratio: 3
Triglycerides: 64 mg/dL (ref 0.0–149.0)
VLDL: 12.8 mg/dL (ref 0.0–40.0)

## 2022-01-23 LAB — BASIC METABOLIC PANEL
BUN: 17 mg/dL (ref 6–23)
CO2: 31 mEq/L (ref 19–32)
Calcium: 9.2 mg/dL (ref 8.4–10.5)
Chloride: 102 mEq/L (ref 96–112)
Creatinine, Ser: 0.93 mg/dL (ref 0.40–1.50)
GFR: 83.66 mL/min (ref >60.00)
Glucose, Bld: 102 mg/dL — ABNORMAL HIGH (ref 70–99)
Potassium: 4 mEq/L (ref 3.5–5.1)
Sodium: 139 mEq/L (ref 135–145)

## 2022-01-23 LAB — PSA: PSA: 0.64 ng/mL (ref 0.10–4.00)

## 2022-01-23 LAB — TSH: TSH: 2.98 u[IU]/mL (ref 0.35–5.50)

## 2022-01-29 ENCOUNTER — Ambulatory Visit (INDEPENDENT_AMBULATORY_CARE_PROVIDER_SITE_OTHER): Payer: Medicare HMO | Admitting: Internal Medicine

## 2022-01-29 ENCOUNTER — Encounter: Payer: Self-pay | Admitting: Internal Medicine

## 2022-01-29 VITALS — BP 122/68 | HR 68 | Temp 97.6°F | Resp 16 | Ht 69.0 in | Wt 193.1 lb

## 2022-01-29 DIAGNOSIS — Z0000 Encounter for general adult medical examination without abnormal findings: Secondary | ICD-10-CM | POA: Diagnosis not present

## 2022-01-29 NOTE — Progress Notes (Signed)
Subjective:    Patient ID: Ralph [name], male    DOB: Feb 02, 1951, 71 y.o.   MRN: [id number]  DOS:  01/29/2022 Type of visit - description: CPX  Here for CPX In general feels great. Occasionally, his bp is low and he feels a slightly dizzy  Review of Systems  Other than above, a 14 point review of systems is negative    Past Medical History:  Diagnosis Date   Basal cell carcinoma 05/07/2015   R mid back    Hyperlipidemia 2007   Hypertension 2007   Inguinal hernia bilateral, non-recurrent    Normal cardiac stress test 2008   per pt    Past Surgical History:  Procedure Laterality Date   INGUINAL HERNIA REPAIR Bilateral 05/31/2018   Procedure: [place];  Surgeon: Coralie Keens, MD;  Location: [place];  Service: General;  Laterality: Bilateral;   INGUINAL HERNIA REPAIR Left 10/07/2018   Procedure: [place];  Surgeon: Coralie Keens, MD;  Location: [place];  Service: General;  Laterality: Left;  TAP BLOCK   INSERTION OF MESH Bilateral 05/31/2018   Procedure: INSERTION OF MESH;  Surgeon: Coralie Keens, MD;  Location: [place];  Service: General;  Laterality: Bilateral;   INSERTION OF MESH Left 10/07/2018   Procedure: INSERTION OF MESH;  Surgeon: Coralie Keens, MD;  Location: [place];  Service: General;  Laterality: Left;   Social History   Socioeconomic History   Marital status: Divorced    Spouse name: Not on file   Number of children: 1   Years of education: Not on file   Highest education level: Not on file  Occupational History   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, hearing aid  Tobacco Use   Smoking status: Never   Smokeless tobacco: Never  Vaping Use   Vaping Use: Never used  Substance and Sexual Activity   Alcohol use: Yes    Comment: socially   Drug use: No   Sexual activity: Yes  Other Topics Concern   Not on file  Social  History Narrative   Original from [place]    Lives w/  girlfriend part time    Lost son ~ 2019 (OD)         Social Determinants of Health   Financial Resource Strain: Low Risk    Difficulty of Paying Living Expenses: Not hard at all  Food Insecurity: No Food Insecurity   Worried About Charity fundraiser in the Last Year: Never true   Arboriculturist in the Last Year: Never true  Transportation Needs: No Transportation Needs   Lack of Transportation (Medical): No   Lack of Transportation (Non-Medical): No  Physical Activity: Sufficiently Active   Days of Exercise per Week: 4 days   Minutes of Exercise per Session: 60 min  Stress: No Stress Concern Present   Feeling of Stress : Not at all  Social Connections: Socially Isolated   Frequency of Communication with Friends and Family: More than three times a week   Frequency of Social Gatherings with Friends and Family: More than three times a week   Attends Religious Services: Never   Marine scientist or Organizations: No   Attends Archivist Meetings: Never   Marital Status: Divorced  Human resources officer Violence: Not At Risk   Fear of Current or Ex-Partner: No   Emotionally Abused: No   Physically Abused: No   Sexually  Abused: No    Current Outpatient Medications  Medication Instructions   amLODipine (NORVASC) 5 mg, Oral, Daily   aspirin 81 mg, Oral, Daily, Swallow whole.   atorvastatin (LIPITOR) 40 mg, Oral, Daily   fluticasone (FLONASE) 50 MCG/ACT nasal spray 2 sprays, Each Nare, Daily PRN   ketoconazole (NIZORAL) 2 % shampoo 1 application, Topical, 2 times weekly   losartan (COZAAR) 100 MG tablet TAKE 1 TABLET BY MOUTH  DAILY   metoprolol succinate (TOPROL-XL) 25 mg, Oral, Daily, Take with or immediately following a meal.   mometasone (ELOCON) 0.1 % cream 1 application, Topical, As directed, Apply thin layer to the affected areas twice daily for up to two weeks       Objective:   Physical Exam BP 122/68 (BP  Location: Left Arm, Patient Position: Sitting, Cuff Size: Small)    Pulse 68    Temp 97.6 F (36.4 C) (Oral)    Resp 16    Ht 5\' 9"  (1.753 m)    Wt 193 lb 2 oz (87.6 kg)    SpO2 97%    BMI 28.52 kg/m  General: Well developed, NAD, BMI noted Neck: No  thyromegaly  HEENT:  Normocephalic . Face symmetric, atraumatic Lungs:  CTA B Normal respiratory effort, no intercostal retractions, no accessory muscle use. Heart: RRR,  no murmur.  Abdomen:  Not distended, soft, non-tender. No rebound or rigidity.   Lower extremities: no pretibial edema bilaterally  Skin: Exposed areas without rash. Not pale. Not jaundice Neurologic:  alert & oriented X3.  Speech normal, gait appropriate for age and unassisted Strength symmetric and appropriate for age.  Psych: Cognition and judgment appear intact.  Cooperative with normal attention span and concentration.  Behavior appropriate. No anxious or depressed appearing.     Assessment     Assessment HTN Hyperlipidemia Tinnitus (used to take xanas very rarely) E.D. Posterior speech is detaching R  BCC, rosacea - derm q year + FH CAD, coronary calcium score February 2023: 94.3  PLAN Here for CPX HTN: See last visit, BP was somewhat elevated, was recommended to continue losartan 100 mg increase metoprolol XL from 25 mg to 50 mg and start amlodipine.  Since then, in 2 to 3 occasions, systolic BP has been 97 and he felt slightly dizzy. We talk about stopping amlodipine, he is hesitant.  We agreed to continue monitoring BPs and let me know if they are low consistently. Hyperlipidemia: Last LDL was 88, + FH CAD. Calcium coronary calcium score 01/16/2022: 94.3 Was RX to up atorvastatin to 40 mg daily;  LDL goal 70.  He did that for a couple of days and felt some aches and pains.   Options:  atorvastatin to 20 mg and add Zetia vs re try  atorvastatin 40 mg. Elected to atorvastatin 40 mg. RTC 4 months.   This visit occurred during the SARS-CoV-2 public  health emergency.  Safety protocols were in place, including screening questions prior to the visit, additional usage of staff PPE, and extensive cleaning of exam room while observing appropriate contact time as indicated for disinfecting solutions.

## 2022-01-29 NOTE — Patient Instructions (Addendum)
Check the  blood pressure regularly BP GOAL is between 110/65 and  135/85. If it is consistently higher or lower, let me know     [place], [place] back for a checkup in 4 months, fasting    "Living will", "[place] of attorney": Advanced care planning  (If you already have a living will or healthcare power of attorney, please bring the copy to be scanned in your chart.)  Advance care planning is a process that supports adults in  understanding and sharing their preferences regarding future medical care.   The patient's preferences are recorded in documents called Advance Directives.    Advanced directives are completed (and can be modified at any time) while the patient is in full mental capacity.   The documentation should be available at all times to the patient, the family and the healthcare providers.  Bring in a copy to be scanned in your chart is an excellent idea and is recommended   This legal documents direct treatment decision making and/or appoint a surrogate to make the decision if the patient is not capable to do so.    Advance directives can be documented in many types of formats,  documents have names such as:  Lliving will  Durable power of attorney for healthcare (healthcare proxy or healthcare power of attorney)  Combined directives  Physician orders for life-sustaining treatment    More information at:  [email or web address]

## 2022-01-30 ENCOUNTER — Encounter: Payer: Self-pay | Admitting: Internal Medicine

## 2022-01-30 NOTE — Assessment & Plan Note (Signed)
°--  Td 2014 --PNM 13: 2020.  PNM 23: 2022 - zostavax: 2012 - shingrix x1, plans to ger #2 -COVID vax booster rec  - Flu shot UTD --Prostate ca screening:   DRE normal last year, last PSA normal --Cscope 01-2006, saw [facility] 04/2018, Cscope June 06/2018, benign bx, 10 years per GI letter to the patient --Diet-exercise: Doing great.  Active, eats healthy.  Goes to the gym -Labs: Done on 01/23/2022, reviewed with the patient - ACP information provided

## 2022-01-30 NOTE — Assessment & Plan Note (Signed)
Here for CPX HTN: See last visit, BP was somewhat elevated, was recommended to continue losartan 100 mg increase metoprolol XL from 25 mg to 50 mg and start amlodipine.  Since then, in 2 to 3 occasions, systolic BP has been 97 and he felt slightly dizzy. We talk about stopping amlodipine, he is hesitant.  We agreed to continue monitoring BPs and let me know if they are low consistently. Hyperlipidemia: Last LDL was 88, + FH CAD. Calcium coronary calcium score 01/16/2022: 94.3 Was RX to up atorvastatin to 40 mg daily;  LDL goal 70.  He did that for a couple of days and felt some aches and pains.   Options:  atorvastatin to 20 mg and add Zetia vs re try  atorvastatin 40 mg. Elected to atorvastatin 40 mg. RTC 4 months.

## 2022-02-06 ENCOUNTER — Encounter: Payer: Self-pay | Admitting: Internal Medicine

## 2022-02-24 ENCOUNTER — Telehealth: Payer: Self-pay

## 2022-02-24 NOTE — Telephone Encounter (Signed)
Pt called for refill of [facility] Rosacea triple cream, please send to [facility]  ? ?Ok Rosacea triple cream sent to [facility]  ?

## 2022-04-04 DIAGNOSIS — D485 Neoplasm of uncertain behavior of skin: Secondary | ICD-10-CM | POA: Diagnosis not present

## 2022-04-04 DIAGNOSIS — C44619 Basal cell carcinoma of skin of left upper limb, including shoulder: Secondary | ICD-10-CM | POA: Diagnosis not present

## 2022-04-04 DIAGNOSIS — C44519 Basal cell carcinoma of skin of other part of trunk: Secondary | ICD-10-CM | POA: Diagnosis not present

## 2022-04-15 ENCOUNTER — Other Ambulatory Visit: Payer: Self-pay | Admitting: Internal Medicine

## 2022-04-17 DIAGNOSIS — C44619 Basal cell carcinoma of skin of left upper limb, including shoulder: Secondary | ICD-10-CM | POA: Diagnosis not present

## 2022-04-17 DIAGNOSIS — C44519 Basal cell carcinoma of skin of other part of trunk: Secondary | ICD-10-CM | POA: Diagnosis not present

## 2022-04-23 ENCOUNTER — Other Ambulatory Visit: Payer: Self-pay | Admitting: Internal Medicine

## 2022-05-09 IMAGING — CT CT CARDIAC CORONARY ARTERY CALCIUM SCORE
3 series · 14 of 20 positions shown, 16 images · non-contrast
Comparison: None

Addendum:
CLINICAL DATA: Cardiovascular Disease Risk stratification

EXAM:
Coronary Calcium Score
TECHNIQUE: A gated, non-contrast computed tomography scan of the heart was
performed using 3mm slice thickness. Axial images were analyzed on a
dedicated workstation. Calcium scoring of the coronary arteries was
performed using the Agatston method.

[Series 2: ax lung · axial · 0.88mm/px · z∈[+1224,+1336]mm · 5 of 86 slices shown]
[im 15/86  lung]
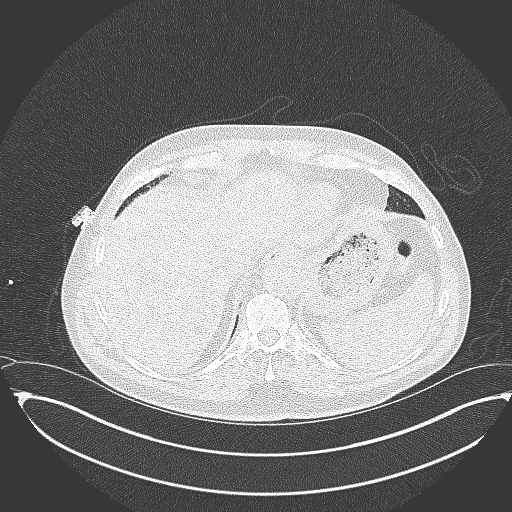
[im 29/86  lung]
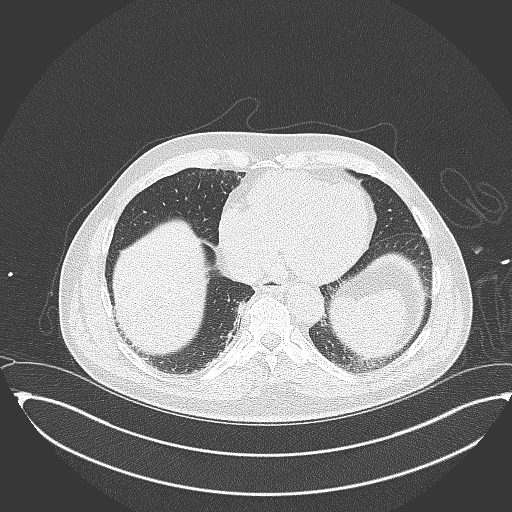
[im 43/86  lung]
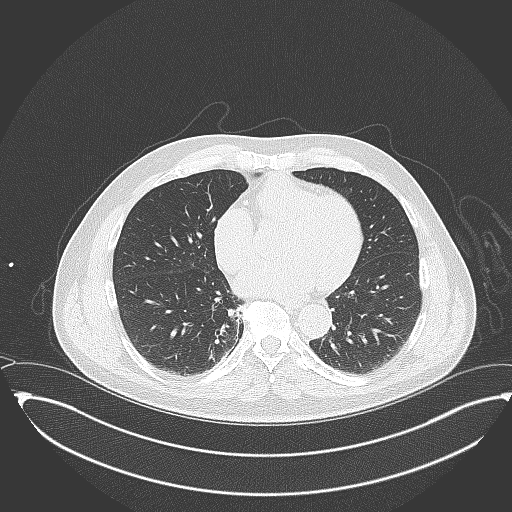
[im 57/86  lung]
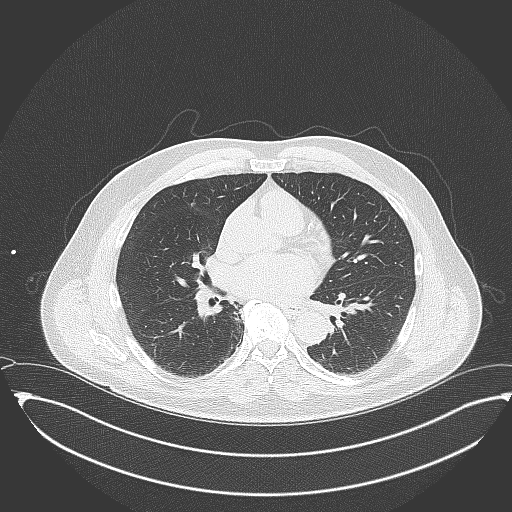
[im 71/86  lung]
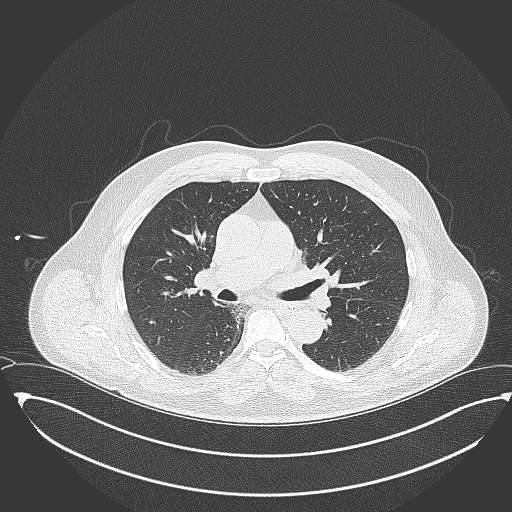

[Series 3: cascseq 3.0 sa36 70% (id) · axial · 0.39mm/px · z∈[+1239,+1323]mm · 3 of 57 slices shown]
[im 15/57  vessel]
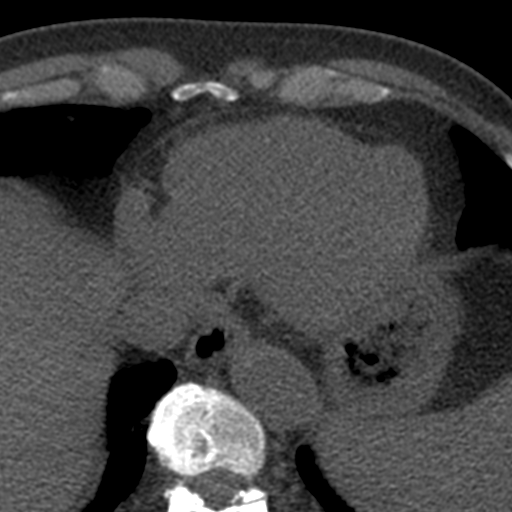
[im 29/57  vessel]
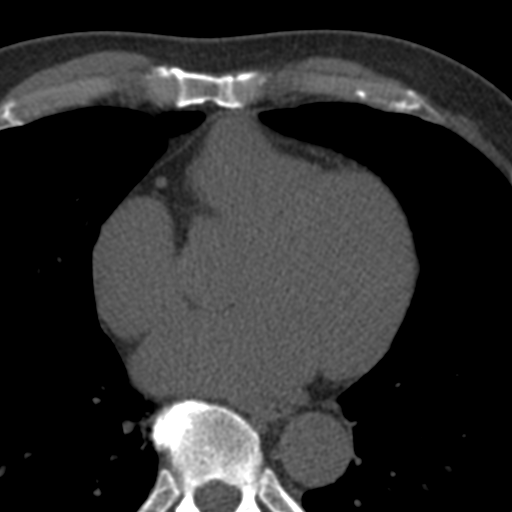
[im 43/57  vessel]
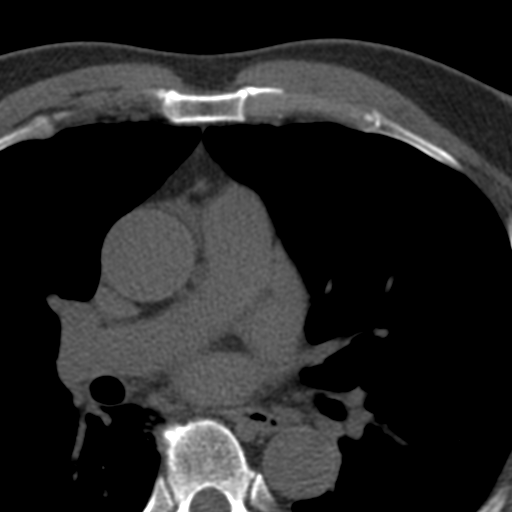

[Series 4: ax st · axial · 0.88mm/px · z∈[+1220,+1340]mm · 6 of 86 slices shown, 8 images]
[im 13/86  vessel]
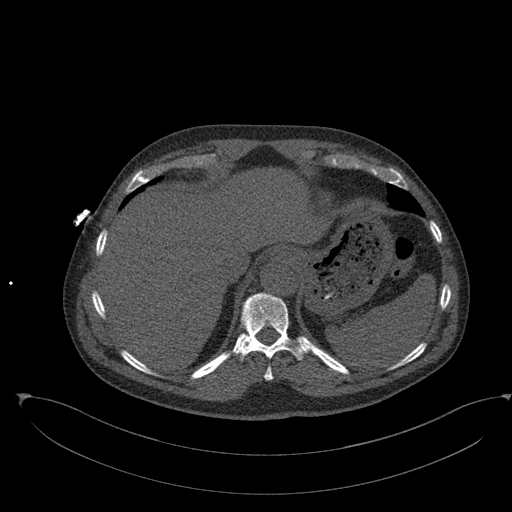
[im 13/86  lung]
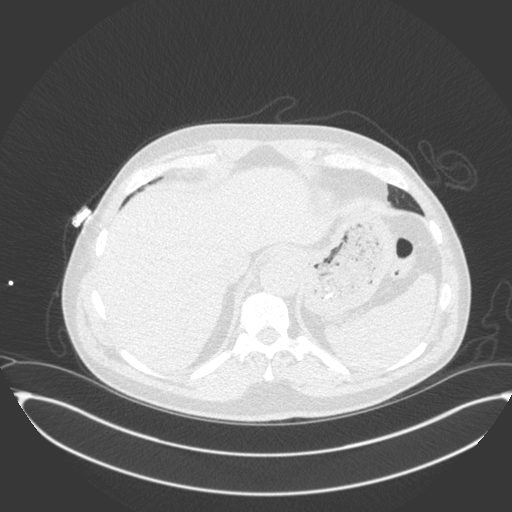
[im 25/86  vessel]
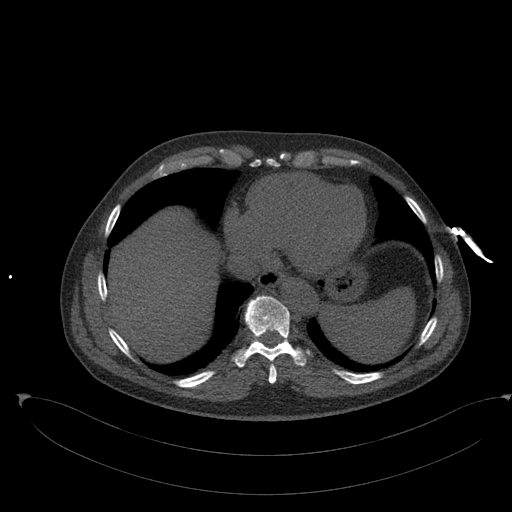
[im 37/86  vessel]
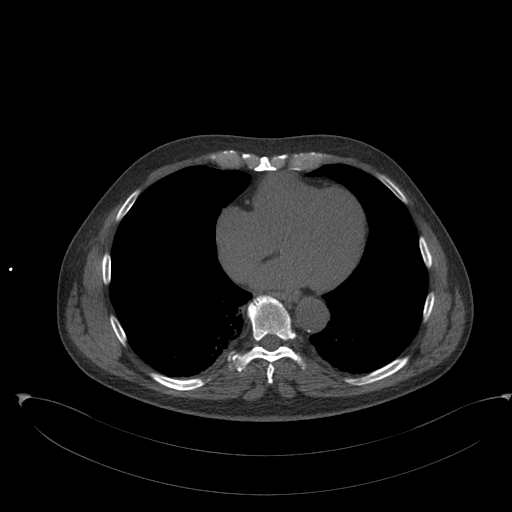
[im 49/86  vessel]
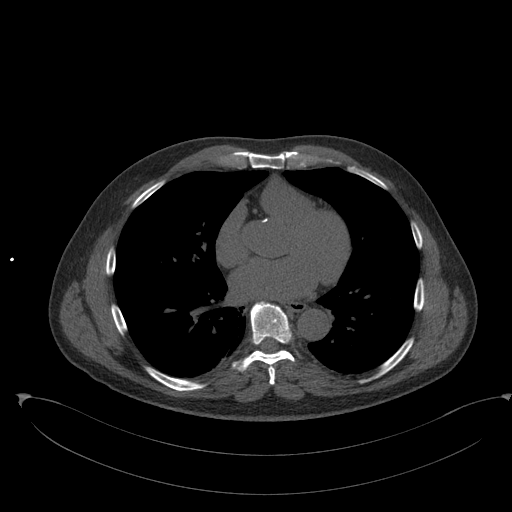
[im 61/86  vessel]
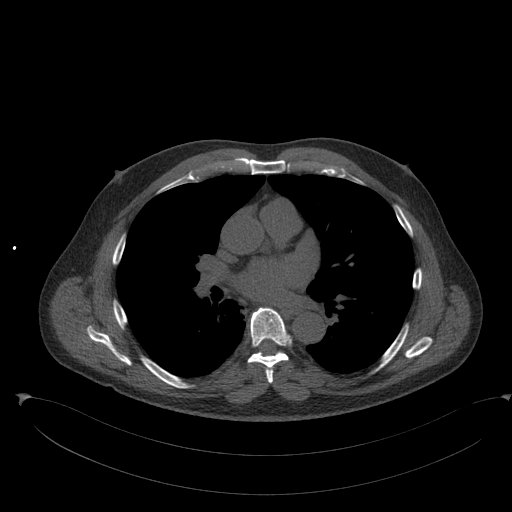
[im 61/86  lung]
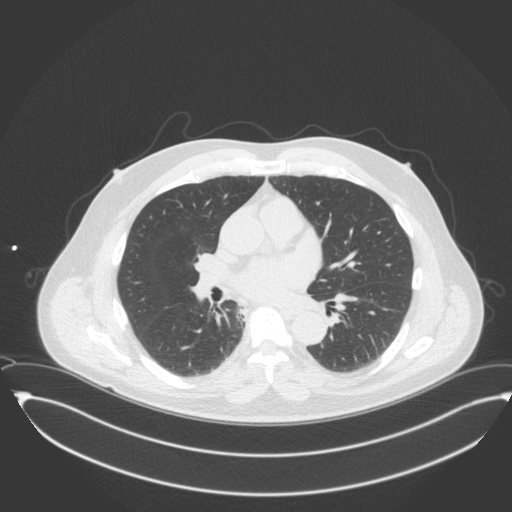
[im 73/86  vessel]
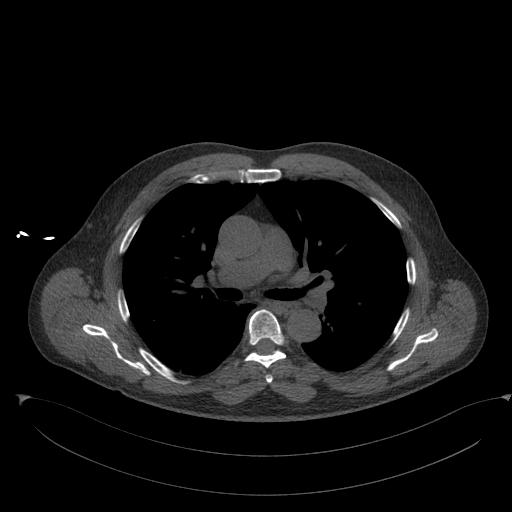

[14 of 20 positions shown; findings below may reference images not displayed]

FINDINGS: Coronary Calcium Score:

Left main: 0

Left anterior descending artery:

Left circumflex artery:

Right coronary artery:

Total:

Percentile: 45

Pericardium: Normal.

Ascending Aorta: Normal caliber.  Aortic atherosclerosis noted.

Non-cardiac: See separate report from [REDACTED].
IMPRESSION: Coronary calcium score of 94.3. This was 45 percentile for age-,
race-, and sex-matched controls.

Aortic atherosclerosis.



If CAC=0, it is reasonable to withhold statin therapy and reassess
in 5 to 10 years, as long as higher risk conditions are absent
(diabetes mellitus, family history of premature CHD in first degree
relatives (males <55 years; females <65 years), cigarette smoking,
or LDL >=190 mg/dL).

If CAC is 1 to 99, it is reasonable to initiate statin therapy for
patients >=55 years of age.

If CAC is >=100 or >=75th percentile, it is reasonable to initiate
statin therapy at any age.

Cardiology referral should be considered for patients with CAC
scores >=400 or >=75th percentile.

*5525 AHA/ACC/AACVPR/AAPA/ABC/KAKI/NOMASIBULELE/TAIWO/Fleming/SHOAEE/DANIEL/JONEZ
Guideline on the Management of Blood Cholesterol: A Report of the
American College of Cardiology/American Heart Association Task Force
on Clinical Practice Guidelines. J Am Coll Cardiol.
2522;73(24):5492-5627.

EXAM:
OVER-READ INTERPRETATION  CT CHEST

The following report is an over-read performed by radiologist Dr.
does not include interpretation of cardiac or coronary anatomy or
pathology. The coronary CT for calcium scoring interpretation by the
cardiologist is attached.
FINDINGS: Cardiovascular: Please see dedicated report for cardiac details.

Mediastinum/Nodes: Visualized portions of the mediastinum, limited
to the mid chest and cardiac structures only based on coverage for
the current scan without signs of adenopathy or acute process.

Lungs/Pleura: Triangular nodularity along the minor fissure
compatible with intrapulmonary lymph node in the RIGHT chest. (Image
32/2) basilar atelectasis/scarring. Airways are patent within the
visualized portions of the chest. No sign of effusion or dense
consolidation.

Upper Abdomen: Incidental imaging of upper abdominal contents
without acute process.

Musculoskeletal: Spinal degenerative changes without acute or
destructive bone process.
IMPRESSION: No acute or significant extracardiac findings.

*** End of Addendum ***
FINDINGS: Coronary Calcium Score:

Left main: 0

Left anterior descending artery:

Left circumflex artery:

Right coronary artery:

Total:

Percentile: 45

Pericardium: Normal.

Ascending Aorta: Normal caliber.  Aortic atherosclerosis noted.

Non-cardiac: See separate report from [REDACTED].
IMPRESSION: Coronary calcium score of 94.3. This was 45 percentile for age-,
race-, and sex-matched controls.

Aortic atherosclerosis.



If CAC=0, it is reasonable to withhold statin therapy and reassess
in 5 to 10 years, as long as higher risk conditions are absent
(diabetes mellitus, family history of premature CHD in first degree
relatives (males <55 years; females <65 years), cigarette smoking,
or LDL >=190 mg/dL).

If CAC is 1 to 99, it is reasonable to initiate statin therapy for
patients >=55 years of age.

If CAC is >=100 or >=75th percentile, it is reasonable to initiate
statin therapy at any age.

Cardiology referral should be considered for patients with CAC
scores >=400 or >=75th percentile.

*5525 AHA/ACC/AACVPR/AAPA/ABC/KAKI/NOMASIBULELE/TAIWO/Fleming/SHOAEE/DANIEL/JONEZ
Guideline on the Management of Blood Cholesterol: A Report of the
American College of Cardiology/American Heart Association Task Force
on Clinical Practice Guidelines. J Am Coll Cardiol.
2522;73(24):5492-5627.

## 2022-05-27 ENCOUNTER — Telehealth: Payer: Self-pay

## 2022-05-27 NOTE — Telephone Encounter (Signed)
Pt called requesting a refill of [facility] rosacea triple cream, okay refills sent to [facility]

## 2022-06-03 ENCOUNTER — Ambulatory Visit: Payer: Medicare HMO | Admitting: Internal Medicine

## 2022-06-05 ENCOUNTER — Telehealth: Payer: Self-pay

## 2022-06-05 DIAGNOSIS — E785 Hyperlipidemia, unspecified: Secondary | ICD-10-CM

## 2022-06-05 NOTE — Telephone Encounter (Signed)
Please advise 

## 2022-06-05 NOTE — Telephone Encounter (Signed)
Patient called wanting to know if he is able to get his bloodwork done prior his appointment on 06/12/22

## 2022-06-05 NOTE — Telephone Encounter (Signed)
AST, ALT, FLP: High cholesterol

## 2022-06-05 NOTE — Addendum Note (Signed)
Addended byDamita Dunnings D on: 06/05/2022 12:48 PM   Modules accepted: Orders

## 2022-06-06 ENCOUNTER — Other Ambulatory Visit (INDEPENDENT_AMBULATORY_CARE_PROVIDER_SITE_OTHER): Payer: Medicare HMO

## 2022-06-06 DIAGNOSIS — E785 Hyperlipidemia, unspecified: Secondary | ICD-10-CM

## 2022-06-06 LAB — ALT: ALT: 19 U/L (ref 0–53)

## 2022-06-06 LAB — LIPID PANEL
Cholesterol: 159 mg/dL (ref 0–200)
HDL: 63.6 mg/dL (ref >39.00)
LDL Cholesterol: 85 mg/dL (ref 0–99)
NonHDL: 95.76
Total CHOL/HDL Ratio: 3
Triglycerides: 56 mg/dL (ref 0.0–149.0)
VLDL: 11.2 mg/dL (ref 0.0–40.0)

## 2022-06-06 LAB — AST: AST: 25 U/L (ref 0–37)

## 2022-06-12 ENCOUNTER — Ambulatory Visit (INDEPENDENT_AMBULATORY_CARE_PROVIDER_SITE_OTHER): Payer: Medicare HMO | Admitting: Internal Medicine

## 2022-06-12 ENCOUNTER — Encounter: Payer: Self-pay | Admitting: Internal Medicine

## 2022-06-12 VITALS — BP 126/70 | HR 69 | Temp 98.0°F | Resp 16 | Ht 69.0 in | Wt 192.1 lb

## 2022-06-12 DIAGNOSIS — Z8249 Family history of ischemic heart disease and other diseases of the circulatory system: Secondary | ICD-10-CM

## 2022-06-12 DIAGNOSIS — C44619 Basal cell carcinoma of skin of left upper limb, including shoulder: Secondary | ICD-10-CM | POA: Insufficient documentation

## 2022-06-12 DIAGNOSIS — C44519 Basal cell carcinoma of skin of other part of trunk: Secondary | ICD-10-CM | POA: Insufficient documentation

## 2022-06-12 DIAGNOSIS — E785 Hyperlipidemia, unspecified: Secondary | ICD-10-CM

## 2022-06-12 DIAGNOSIS — I10 Essential (primary) hypertension: Secondary | ICD-10-CM | POA: Diagnosis not present

## 2022-06-12 MED ORDER — EZETIMIBE 10 MG PO TABS
10.0000 mg | ORAL_TABLET | Freq: Every day | ORAL | 3 refills | Status: DC
Start: 2022-06-12 — End: 2023-06-10

## 2022-06-12 MED ORDER — SHINGRIX 50 MCG/0.5ML IM SUSR: 0.5000 mL | Freq: Once | INTRAMUSCULAR | 1 refills | Status: DC

## 2022-06-12 NOTE — Progress Notes (Signed)
Subjective:    Patient ID: Ralph [name], male    DOB: Sep 21, 1951, 71 y.o.   MRN: [id number]  DOS:  06/12/2022 Type of visit - description: Follow-up  Since the last office visit is doing well. BP was getting low, he decided to take only losartan.  BP remains very good. Taking atorvastatin without apparent side effects  Review of Systems See above   Past Medical History:  Diagnosis Date   Basal cell carcinoma 05/07/2015   R mid back    Hyperlipidemia 2007   Hypertension 2007   Inguinal hernia bilateral, non-recurrent    Normal cardiac stress test 2008   per pt    Past Surgical History:  Procedure Laterality Date   INGUINAL HERNIA REPAIR Bilateral 05/31/2018   Procedure: LAPAROSCOPIC BILATERAL INGUINAL HERNIA REPAIR ERAS PATHWAY;  Surgeon: Coralie Keens, MD;  Location: [place];  Service: General;  Laterality: Bilateral;   INGUINAL HERNIA REPAIR Left 10/07/2018   Procedure: [place];  Surgeon: Coralie Keens, MD;  Location: [place];  Service: General;  Laterality: Left;  TAP BLOCK   INSERTION OF MESH Bilateral 05/31/2018   Procedure: INSERTION OF MESH;  Surgeon: Coralie Keens, MD;  Location: [facility];  Service: General;  Laterality: Bilateral;   INSERTION OF MESH Left 10/07/2018   Procedure: INSERTION OF MESH;  Surgeon: Coralie Keens, MD;  Location: [place];  Service: General;  Laterality: Left;    Current Outpatient Medications  Medication Instructions   amLODipine (NORVASC) 5 mg, Oral, Daily   aspirin EC 81 mg, Oral, Daily, Swallow whole.   atorvastatin (LIPITOR) 40 MG tablet TAKE 1 TABLET BY MOUTH EVERY DAY   fluticasone (FLONASE) 50 MCG/ACT nasal spray 2 sprays, Each Nare, Daily PRN   ketoconazole (NIZORAL) 2 % shampoo 1 application , Topical, 2 times weekly   losartan (COZAAR) 100 MG tablet TAKE 1 TABLET BY MOUTH EVERY DAY   metoprolol succinate (TOPROL-XL) 25 mg, Daily   mometasone  (ELOCON) 0.1 % cream 1 application , Topical, As directed, Apply thin layer to the affected areas twice daily for up to two weeks   Zoster Vaccine Adjuvanted [facility]) injection 0.5 mLs, Intramuscular,  Once       Objective:   Physical Exam BP 126/70   Pulse 69   Temp 98 F (36.7 C) (Oral)   Resp 16   Ht '5\' 9"'$  (1.753 m)   Wt 192 lb 2 oz (87.1 kg)   SpO2 96%   BMI 28.37 kg/m  General:   Well developed, NAD, BMI noted. HEENT:  Normocephalic . Face symmetric, atraumatic Lungs:  CTA B Normal respiratory effort, no intercostal retractions, no accessory muscle use. Heart: RRR,  no murmur.  Lower extremities: no pretibial edema bilaterally  Skin: Not pale. Not jaundice Neurologic:  alert & oriented X3.  Speech normal, gait appropriate for age and unassisted Psych--  Cognition and judgment appear intact.  Cooperative with normal attention span and concentration.  Behavior appropriate. No anxious or depressed appearing.      Assessment     Assessment HTN Hyperlipidemia Tinnitus (used to take xanas very rarely) E.D. Posterior speech is detaching R  BCC, rosacea - derm q year + FH CAD, coronary calcium score February 2023: 94.3  PLAN HTN: BP was getting in the 90s, he self discontinued metoprolol and amlodipine, stay only on losartan, ambulatory BPs are great ranging from the 116/64 to 124/78.  We will continue with losartan only  and continue monitoring BPs Hyperlipidemia: Since the last visit he is taking atorvastatin 40 mg daily without apparent s/e, LDL few days ago was 85, due to [place] and results of his  calcium coronary score LDL goal is 70.  Plan: Add Zetia.  Continue healthy lifestyle. RTC CPX 01/2023

## 2022-06-12 NOTE — Assessment & Plan Note (Signed)
HTN: BP was getting in the 90s, he self discontinued metoprolol and amlodipine, stay only on losartan, ambulatory BPs are great ranging from the 116/64 to 124/78.  We will continue with losartan only and continue monitoring BPs Hyperlipidemia: Since the last visit he is taking atorvastatin 40 mg daily without apparent s/e, LDL few days ago was 85, due to [place] and results of his  calcium coronary score LDL goal is 70.  Plan: Add Zetia.  Continue healthy lifestyle. RTC CPX 01/2023

## 2022-06-12 NOTE — Patient Instructions (Addendum)
Recommend the following vaccines at your pharmacy: Flu shot this fall  We are adding Zetia 10 mg every day to help your cholesterol  Continue checking your cholesterol regularly BP GOAL is between 110/65 and  135/85. If it is consistently higher or lower, let me know   [place], PLEASE SCHEDULE YOUR APPOINTMENTS Come back for physical exam by 01-2023

## 2022-10-09 ENCOUNTER — Encounter: Payer: Self-pay | Admitting: Dermatology

## 2022-10-09 ENCOUNTER — Ambulatory Visit: Payer: Medicare HMO | Admitting: Dermatology

## 2022-10-09 ENCOUNTER — Other Ambulatory Visit: Payer: Self-pay | Admitting: Internal Medicine

## 2022-10-09 DIAGNOSIS — C44519 Basal cell carcinoma of skin of other part of trunk: Secondary | ICD-10-CM

## 2022-10-09 DIAGNOSIS — D045 Carcinoma in situ of skin of trunk: Secondary | ICD-10-CM | POA: Diagnosis not present

## 2022-10-09 DIAGNOSIS — L820 Inflamed seborrheic keratosis: Secondary | ICD-10-CM

## 2022-10-09 DIAGNOSIS — L578 Other skin changes due to chronic exposure to nonionizing radiation: Secondary | ICD-10-CM

## 2022-10-09 DIAGNOSIS — L821 Other seborrheic keratosis: Secondary | ICD-10-CM

## 2022-10-09 DIAGNOSIS — Z85828 Personal history of other malignant neoplasm of skin: Secondary | ICD-10-CM

## 2022-10-09 DIAGNOSIS — L570 Actinic keratosis: Secondary | ICD-10-CM

## 2022-10-09 DIAGNOSIS — C44619 Basal cell carcinoma of skin of left upper limb, including shoulder: Secondary | ICD-10-CM | POA: Diagnosis not present

## 2022-10-09 DIAGNOSIS — Z1283 Encounter for screening for malignant neoplasm of skin: Secondary | ICD-10-CM

## 2022-10-09 DIAGNOSIS — C44719 Basal cell carcinoma of skin of left lower limb, including hip: Secondary | ICD-10-CM | POA: Diagnosis not present

## 2022-10-09 DIAGNOSIS — L719 Rosacea, unspecified: Secondary | ICD-10-CM | POA: Diagnosis not present

## 2022-10-09 DIAGNOSIS — D492 Neoplasm of unspecified behavior of bone, soft tissue, and skin: Secondary | ICD-10-CM

## 2022-10-09 DIAGNOSIS — C4492 Squamous cell carcinoma of skin, unspecified: Secondary | ICD-10-CM

## 2022-10-09 HISTORY — DX: Squamous cell carcinoma of skin, unspecified: C44.92

## 2022-10-09 MED ORDER — IVERMECTIN 1 % EX CREA: 1.0000 | TOPICAL_CREAM | Freq: Every day | CUTANEOUS | 11 refills | Status: AC

## 2022-10-09 MED ORDER — KETOCONAZOLE 2 % EX SHAM: 1.0000 | MEDICATED_SHAMPOO | CUTANEOUS | 11 refills | Status: DC

## 2022-10-09 NOTE — Patient Instructions (Addendum)
Cryotherapy Aftercare  Wash gently with soap and water everyday.   Apply Vaseline and Band-Aid daily until healed.     Instructions for [facility] Medications  One or more of your medications was sent to the [facility] mail order compounding pharmacy. You will receive an email from them and can purchase the medicine through that link. It will then be mailed to your home at the address you confirmed. If for any reason you do not receive an email from them, please check your spam folder. If you still do not find the email, please let us know. [facility] phone number is [phone].    Wound Care Instructions  Cleanse wound gently with soap and water once a day then pat dry with clean gauze. Apply a thin coat of Petrolatum (petroleum jelly, "Vaseline") over the wound (unless you have an allergy to this). We recommend that you use a new, sterile tube of Vaseline. Do not pick or remove scabs. Do not remove the yellow or white "healing tissue" from the base of the wound.  Cover the wound with fresh, clean, nonstick gauze and secure with paper tape. You may use Band-Aids in place of gauze and tape if the wound is small enough, but would recommend trimming much of the tape off as there is often too much. Sometimes Band-Aids can irritate the skin.  You should call the office for your biopsy report after 1 week if you have not already been contacted.  If you experience any problems, such as abnormal amounts of bleeding, swelling, significant bruising, significant pain, or evidence of infection, please call the office immediately.  FOR ADULT SURGERY PATIENTS: If you need something for pain relief you may take 1 extra strength Tylenol (acetaminophen) AND 2 Ibuprofen ('200mg'$  each) together every 4 hours as needed for pain. (do not take these if you are allergic to them or if you have a reason you should not take them.) Typically, you may only need pain medication for 1 to 3 days.       Due to recent changes in healthcare laws, you may see results of your pathology and/or laboratory studies on MyChart before the doctors have had a chance to review them. We understand that in some cases there may be results that are confusing or concerning to you. Please understand that not all results are received at the same time and often the doctors may need to interpret multiple results in order to provide you with the best plan of care or course of treatment. Therefore, we ask that you please give [place] 2 business days to thoroughly review all your results before contacting the office for clarification. Should we see a critical lab result, you will be contacted sooner.   If You Need Anything After Your Visit  If you have any questions or concerns for your doctor, please call our main line at [phone] and press option 4 to reach your doctor's medical assistant. If no one answers, please leave a voicemail as directed and we will return your call as soon as possible. Messages left after 4 pm will be answered the following business day.   You may also send [place] a message via [place]. We typically respond to MyChart messages within 1-2 business days.  For prescription refills, please ask your pharmacy to contact our office. Our fax number is [phone].  If you have an urgent issue when the clinic is closed that cannot wait until the next business day, you can page your  doctor at the number below.    Please note that while we do our best to be available for urgent issues outside of office hours, we are not available 24/7.   If you have an urgent issue and are unable to reach [place], you may choose to seek medical care at your doctor's office, retail clinic, urgent care center, or emergency room.  If you have a medical emergency, please immediately call 911 or go to the emergency department.  Pager Numbers  - Dr. Nehemiah Massed: [phone]  - Dr. Laurence Ferrari: [phone]  - Dr. Nicole Kindred:  [phone]  In the event of inclement weather, please call our main line at [phone] for an update on the status of any delays or closures.  Dermatology Medication Tips: Please keep the boxes that topical medications come in in order to help keep track of the instructions about where and how to use these. Pharmacies typically print the medication instructions only on the boxes and not directly on the medication tubes.   If your medication is too expensive, please contact our office at [phone] option 4 or send [place] a message through [place].   We are unable to tell what your co-pay for medications will be in advance as this is different depending on your insurance coverage. However, we may be able to find a substitute medication at lower cost or fill out paperwork to get insurance to cover a needed medication.   If a prior authorization is required to get your medication covered by your insurance company, please allow [place] 1-2 business days to complete this process.  Drug prices often vary depending on where the prescription is filled and some pharmacies may offer cheaper prices.  The website www.goodrx.com contains coupons for medications through different pharmacies. The prices here do not account for what the cost may be with help from insurance (it may be cheaper with your insurance), but the website can give you the price if you did not use any insurance.  - You can print the associated coupon and take it with your prescription to the pharmacy.  - You may also stop by our office during regular business hours and pick up a GoodRx coupon card.  - If you need your prescription sent electronically to a different pharmacy, notify our office through [facility] or by phone at [phone] option 4.     Si Usted Necesita Algo Despus de Su Visita  Tambin puede enviarnos un mensaje a travs de Pharmacist, community. Por lo general respondemos a los mensajes de MyChart en el transcurso de 1 a 2  das hbiles.  Para renovar recetas, por favor pida a su farmacia que se ponga en contacto con nuestra oficina. Harland Dingwall de fax es [place] [phone].  Si tiene un asunto urgente cuando la clnica est cerrada y que no puede esperar hasta el siguiente da hbil, puede llamar/localizar a su doctor(a) al nmero que aparece a continuacin.   Por favor, tenga en cuenta que aunque hacemos todo lo posible para estar disponibles para asuntos urgentes fuera del horario de [place], no estamos disponibles las 24 horas del da, los 7 das de la [place].   Si tiene un problema urgente y no puede comunicarse con nosotros, puede optar por buscar atencin mdica  en el consultorio de su doctor(a), en una clnica privada, en un centro de atencin urgente o en una sala de emergencias.  Si tiene Engineering geologist, por favor llame inmediatamente al 911 o vaya a la sala de emergencias.  Nmeros de bper  - Dr. Nehemiah Massed: [phone]  - Dra. Moye: [phone]  - Dra. Nicole Kindred: [phone]  En caso de inclemencias del [place], por favor llame a Johnsie Kindred principal al [phone] para una actualizacin sobre el [place] de cualquier retraso o cierre.  Consejos para la medicacin en dermatologa: Por favor, guarde las cajas en las que vienen los medicamentos de uso tpico para ayudarle a seguir las instrucciones sobre dnde y cmo usarlos. Las farmacias generalmente imprimen las instrucciones del medicamento slo en las cajas y no directamente en los tubos del [place].   Si su medicamento es muy caro, por favor, pngase en contacto con Zigmund Daniel llamando al [phone] y presione la opcin 4 o envenos un mensaje a travs de Pharmacist, community.   No podemos decirle cul ser su copago por los medicamentos por adelantado ya que esto es diferente dependiendo de la cobertura de su seguro. Sin embargo, es posible que podamos encontrar un medicamento sustituto a Electrical engineer un formulario para que el  seguro cubra el medicamento que se considera necesario.   Si se requiere una autorizacin previa para que su compaa de seguros [place] su medicamento, por favor permtanos de 1 a 2 das hbiles para completar este proceso.  Los precios de los medicamentos varan con frecuencia dependiendo del Environmental consultant de dnde se surte la receta y alguna farmacias pueden ofrecer precios ms baratos.  El sitio web www.goodrx.com tiene cupones para medicamentos de Airline pilot. Los precios aqu no tienen en cuenta lo que podra costar con la ayuda del seguro (puede ser ms barato con su seguro), pero el sitio web puede darle el precio si no utiliz Research scientist (physical sciences).  - Puede imprimir el cupn correspondiente y llevarlo con su receta a la farmacia.  - Tambin puede pasar por nuestra oficina durante el horario de atencin regular y Charity fundraiser una tarjeta de cupones de GoodRx.  - Si necesita que su receta se enve electrnicamente a una farmacia diferente, informe a nuestra oficina a travs de MyChart de [place] o por telfono llamando al [phone] y presione la opcin 4.

## 2022-10-09 NOTE — Progress Notes (Unsigned)
Follow-Up Visit   Subjective  Ralph [name] is a 71 y.o. male who presents for the following: Rosacea (Scalp, Ketoconazole 2% shampoo 2x/wk, SM Rosacea triple cream ), Actinic Keratosis (Face, 1 yr f/u, 42f/calcipotriene cream bic x 1 wk last year), hx of BCC (L shoulder, txted 04/17/2022 by CCamillo Flaming, and Upper body skin exam (Check spots, legs, arms). The patient presents for Upper Body Skin Exam (UBSE) for skin cancer screening and mole check.  The patient has spots, moles and lesions to be evaluated, some may be new or changing and the patient has concerns that these could be cancer.  The following portions of the chart were reviewed this encounter and updated as appropriate:   Tobacco  Allergies  Meds  Problems  Med Hx  Surg Hx  Fam Hx     Review of Systems:  No other skin or systemic complaints except as noted in HPI or Assessment and Plan.  Objective  Well appearing patient in no apparent distress; mood and affect are within normal limits.  All skin waist up examined.  Scalp Clear today  scalp, ears x 16 (16) Pink scaly macules  L inf medial scapula Crusted pap 1.0cm     R pectoral 1.0cm crusted pap     L prox medial calf 1.1cm crusted pap     R lower leg x 1 Stuck on waxy paps with erythema   Assessment & Plan   Actinic Damage - chronic, secondary to cumulative UV radiation exposure/sun exposure over time - diffuse scaly erythematous macules with underlying dyspigmentation - Recommend daily broad spectrum sunscreen SPF 30+ to sun-exposed areas, reapply every 2 hours as needed.  - Recommend staying in the shade or wearing long sleeves, sun glasses (UVA+UVB protection) and wide brim hats (4-inch brim around the entire circumference of the hat). - Call for new or changing lesions.   Seborrheic Keratoses - Stuck-on, waxy, tan-brown papules and/or plaques  - Benign-appearing - Discussed benign etiology and prognosis. - Observe - Call  for any changes  History of Basal Cell Carcinoma of the Skin - No evidence of recurrence today - Recommend regular full body skin exams - Recommend daily broad spectrum sunscreen SPF 30+ to sun-exposed areas, reapply every 2 hours as needed.  - Call if any new or changing lesions are noted between office visits  - L top of shoulder, R mid back, Mid back  Rosacea /folliculitis Scalp Chronic and persistent condition with duration or expected duration over one year. Condition is symptomatic / bothersome to patient. Not to goal.  Rosacea is a chronic progressive skin condition usually affecting the face of adults, causing redness and/or acne bumps. It is treatable but not curable. It sometimes affects the eyes (ocular rosacea) as well. It may respond to topical and/or systemic medication and can flare with stress, sun exposure, alcohol, exercise, topical steroids (including hydrocortisone/cortisone 10) and some foods.  Daily application of broad spectrum spf 30+ sunscreen to face is recommended to reduce flares.  Cont Ketoconazole 2% cr 2-3x/wk, let sit 5 minutes and rinse out Cont SM Triple cream qhs to scalp  Ivermectin 1 % CREA - Scalp Apply 1 Application topically at bedtime. Qhs to scalp  ketoconazole (NIZORAL) 2 % shampoo - Scalp Apply 1 Application topically 2 (two) times a week. Wash scalp 2 times weekly, let sit 5 minutes and rinse out  AK (actinic keratosis) (16) scalp, ears x 16 Destruction of lesion - scalp, ears x 16 Complexity: simple  Destruction method: cryotherapy   Informed consent: discussed and consent obtained   Timeout:  patient name, date of birth, surgical site, and procedure verified Lesion destroyed using liquid nitrogen: Yes   Region frozen until ice ball extended beyond lesion: Yes   Outcome: patient tolerated procedure well with no complications   Post-procedure details: wound care instructions given    Neoplasm of skin (3) L inf medial scapula Epidermal  / dermal shaving  Lesion diameter (cm):  1 Informed consent: discussed and consent obtained   Timeout: patient name, date of birth, surgical site, and procedure verified   Procedure prep:  Patient was prepped and draped in usual sterile fashion Prep type:  Isopropyl alcohol Anesthesia: the lesion was anesthetized in a standard fashion   Anesthetic:  1% lidocaine w/ epinephrine 1-100,000 buffered w/ 8.4% NaHCO3 Instrument used: flexible razor blade   Hemostasis achieved with: pressure, aluminum chloride and electrodesiccation   Outcome: patient tolerated procedure well   Post-procedure details: sterile dressing applied and wound care instructions given   Dressing type: bandage and petrolatum    Destruction of lesion Complexity: extensive   Destruction method: electrodesiccation and curettage   Informed consent: discussed and consent obtained   Timeout:  patient name, date of birth, surgical site, and procedure verified Procedure prep:  Patient was prepped and draped in usual sterile fashion Prep type:  Isopropyl alcohol Anesthesia: the lesion was anesthetized in a standard fashion   Anesthetic:  1% lidocaine w/ epinephrine 1-100,000 buffered w/ 8.4% NaHCO3 Curettage performed in three different directions: Yes   Electrodesiccation performed over the curetted area: Yes   Lesion length (cm):  1 Lesion width (cm):  1 Margin per side (cm):  0.2 Final wound size (cm):  1.4 Hemostasis achieved with:  pressure, aluminum chloride and electrodesiccation Outcome: patient tolerated procedure well with no complications   Post-procedure details: sterile dressing applied and wound care instructions given   Dressing type: bandage and bacitracin    Specimen 1 - Surgical pathology Differential Diagnosis: D48.5 R/O BCC Check Margins: No Crusted pap 1.0cm EDC  R pectoral Epidermal / dermal shaving  Lesion diameter (cm):  1 Informed consent: discussed and consent obtained   Timeout: patient  name, date of birth, surgical site, and procedure verified   Procedure prep:  Patient was prepped and draped in usual sterile fashion Prep type:  Isopropyl alcohol Anesthesia: the lesion was anesthetized in a standard fashion   Anesthetic:  1% lidocaine w/ epinephrine 1-100,000 buffered w/ 8.4% NaHCO3 Instrument used: flexible razor blade   Hemostasis achieved with: pressure, aluminum chloride and electrodesiccation   Outcome: patient tolerated procedure well   Post-procedure details: sterile dressing applied and wound care instructions given   Dressing type: bandage and bacitracin    Destruction of lesion Complexity: extensive   Destruction method: electrodesiccation and curettage   Informed consent: discussed and consent obtained   Timeout:  patient name, date of birth, surgical site, and procedure verified Procedure prep:  Patient was prepped and draped in usual sterile fashion Prep type:  Isopropyl alcohol Anesthesia: the lesion was anesthetized in a standard fashion   Anesthetic:  1% lidocaine w/ epinephrine 1-100,000 buffered w/ 8.4% NaHCO3 Curettage performed in three different directions: Yes   Electrodesiccation performed over the curetted area: Yes   Lesion length (cm):  1 Lesion width (cm):  1 Margin per side (cm):  0.2 Final wound size (cm):  1.4 Hemostasis achieved with:  pressure, aluminum chloride and electrodesiccation Outcome: patient tolerated procedure well  with no complications   Post-procedure details: sterile dressing applied and wound care instructions given   Dressing type: bandage and bacitracin    Specimen 2 - Surgical pathology Differential Diagnosis: D48.5 R/O BCC Check Margins: No 1.0cm crusted pap EDC  L prox medial calf Epidermal / dermal shaving  Lesion diameter (cm):  1.1 Informed consent: discussed and consent obtained   Timeout: patient name, date of birth, surgical site, and procedure verified   Procedure prep:  Patient was prepped and  draped in usual sterile fashion Prep type:  Isopropyl alcohol Anesthesia: the lesion was anesthetized in a standard fashion   Anesthetic:  1% lidocaine w/ epinephrine 1-100,000 buffered w/ 8.4% NaHCO3 Instrument used: flexible razor blade   Hemostasis achieved with: pressure, aluminum chloride and electrodesiccation   Outcome: patient tolerated procedure well   Post-procedure details: sterile dressing applied and wound care instructions given   Dressing type: bandage and bacitracin    Destruction of lesion Complexity: extensive   Destruction method: electrodesiccation and curettage   Informed consent: discussed and consent obtained   Timeout:  patient name, date of birth, surgical site, and procedure verified Procedure prep:  Patient was prepped and draped in usual sterile fashion Prep type:  Isopropyl alcohol Anesthesia: the lesion was anesthetized in a standard fashion   Anesthetic:  1% lidocaine w/ epinephrine 1-100,000 buffered w/ 8.4% NaHCO3 Curettage performed in three different directions: Yes   Electrodesiccation performed over the curetted area: Yes   Lesion length (cm):  1.1 Lesion width (cm):  1.1 Margin per side (cm):  0.2 Final wound size (cm):  1.5 Hemostasis achieved with:  pressure, aluminum chloride and electrodesiccation Outcome: patient tolerated procedure well with no complications   Post-procedure details: sterile dressing applied and wound care instructions given   Dressing type: bandage and bacitracin    Specimen 3 - Surgical pathology Differential Diagnosis: D48.5 R/O BCC Check Margins: No 1.1cm crusted pap EDC  Inflamed seborrheic keratosis R lower leg x 1 Symptomatic, irritating, patient would like treated. Destruction of lesion - R lower leg x 1 Complexity: simple   Destruction method: cryotherapy   Informed consent: discussed and consent obtained   Timeout:  patient name, date of birth, surgical site, and procedure verified Lesion destroyed  using liquid nitrogen: Yes   Region frozen until ice ball extended beyond lesion: Yes   Outcome: patient tolerated procedure well with no complications   Post-procedure details: wound care instructions given    Return in about 1 year (around 10/10/2023) for Hx of BCC, Hx of AKs, TBSE.  I, Othelia Pulling, RMA, am acting as scribe for Sarina Ser, MD . Documentation: I have reviewed the above documentation for accuracy and completeness, and I agree with the above.  Sarina Ser, MD

## 2022-10-13 ENCOUNTER — Encounter: Payer: Self-pay | Admitting: Dermatology

## 2022-10-15 ENCOUNTER — Telehealth: Payer: Self-pay

## 2022-10-15 NOTE — Telephone Encounter (Signed)
Advised patient of pathology results/hd 

## 2022-10-15 NOTE — Telephone Encounter (Signed)
-----   Message from Ralene Bathe, MD sent at 10/14/2022  6:03 PM EDT ----- Diagnosis 1. Skin , left inf medial scapula BASAL CELL CARCINOMA, SUPERFICIAL AND NODULAR PATTERNS 2. 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 Skin , left prox medial calf BASAL CELL CARCINOMA, SUPERFICIAL AND NODULAR PATTERNS  1& 3- both Cancer - BCC Both already treated Recheck next visit 2- Cancer - SCC in situ Superficial Already treated Recheck next visit

## 2022-10-20 DIAGNOSIS — Z8249 Family history of ischemic heart disease and other diseases of the circulatory system: Secondary | ICD-10-CM | POA: Diagnosis not present

## 2022-10-20 DIAGNOSIS — Z880 Allergy status to penicillin: Secondary | ICD-10-CM | POA: Diagnosis not present

## 2022-10-20 DIAGNOSIS — I10 Essential (primary) hypertension: Secondary | ICD-10-CM | POA: Diagnosis not present

## 2022-10-20 DIAGNOSIS — Z803 Family history of malignant neoplasm of breast: Secondary | ICD-10-CM | POA: Diagnosis not present

## 2022-10-20 DIAGNOSIS — E785 Hyperlipidemia, unspecified: Secondary | ICD-10-CM | POA: Diagnosis not present

## 2022-10-20 DIAGNOSIS — Z008 Encounter for other general examination: Secondary | ICD-10-CM | POA: Diagnosis not present

## 2022-10-20 DIAGNOSIS — Z85828 Personal history of other malignant neoplasm of skin: Secondary | ICD-10-CM | POA: Diagnosis not present

## 2022-11-13 ENCOUNTER — Telehealth (INDEPENDENT_AMBULATORY_CARE_PROVIDER_SITE_OTHER): Payer: Medicare HMO | Admitting: Family Medicine

## 2022-11-13 ENCOUNTER — Encounter: Payer: Medicare HMO | Admitting: Family Medicine

## 2022-11-13 ENCOUNTER — Encounter: Payer: Self-pay | Admitting: Family Medicine

## 2022-11-13 VITALS — BP 125/74 | Ht 69.0 in | Wt 190.0 lb

## 2022-11-13 DIAGNOSIS — Z0000 Encounter for general adult medical examination without abnormal findings: Secondary | ICD-10-CM

## 2022-11-13 NOTE — Progress Notes (Signed)
PATIENT CHECK-IN and HEALTH RISK ASSESSMENT QUESTIONNAIRE:  -completed by phone/video for upcoming Medicare Preventive Visit  Pre-Visit Check-in: 1)Vitals (height, wt, BP, etc) - record in vitals section for visit on day of visit 2)Review and Update Medications, Allergies PMH, Surgeries, Social history in Epic 3)Hospitalizations in the last year with date/reason? no  4)Review and Update Care Team (patient's specialists) in Epic 5) Complete PHQ9 in Epic  6) Complete Fall Screening in Epic 7)Review all Health Maintenance Due and order under PCP if not done.  Medicare Wellness Patient Questionnaire:  Answer theses question about your habits: Do you drink alcohol? yes If yes, how many drinks do you have a day?a couple a week, 2 drink, rarely more Have you ever smoked?no Quit date if applicable?   How many packs a day do/did you smoke?  Do you use smokeless tobacco?no Do you use an illicit drugs?no Do you exercises? Yes if so, what type and how many days/minutes per week?1 1/2 hours 4 times a week Are you sexually active? yes Number of partners? 1 Typical breakfast eggs or oatmeal Typical lunch tuna or chicken Typical dinner chicken, veggies Typical snacks: banana  Beverages: water and coffee  Answer theses question about you: Can you perform most household chores?yes Do you find it hard to follow a conversation in a noisy room?no Do you often ask people to speak up or repeat themselves?no Do you feel that you have a problem with memory?no Do you balance your checkbook and or bank acounts?yes Do you feel safe at home?yes Last dentist visit yes  Do you need assistance with any of the following: Please note if so no  Driving?  Feeding yourself?  Getting from bed to chair?  Getting to the toilet?  Bathing or showering?  Dressing yourself?  Managing money?  Climbing a flight of stairs  Preparing meals?    Do you have Advanced Directives in place (Living Will, Healthcare Power  or Attorney)? no   Last eye Exam and location? Retina specialist  year and half ago, sees Dr. Coralyn Pear   Do you currently use prescribed or non-prescribed narcotic or opioid pain medications? no  Do you have a history or close family history of breast, ovarian, tubal or peritoneal cancer or a family member with BRCA (breast cancer susceptibility 1 and 2) gene mutations? Mother had breast  Nurse/Assistant Credentials/time stamp:  Westley Hummer CMA ----------------------------------------------------------------------------------------------------------------------------------------------------------------------------------------------------------------------    [facility] VISIT WITH PROVIDER (Welcome to Medicare, initial annual wellness or annual wellness exam)  Virtual Visit via Video Note  I connected with Avram  on 11/13/2022 by a video enabled telemedicine application and verified that I am speaking with the correct person using two identifiers.  Location patient: home Location provider:work or home office Persons participating in the virtual visit: patient, provider  Concerns and/or follow up today: none  See HM section in Epic for other details of completed HM.    ROS: negative for report of fevers, unintentional weight loss, vision changes, vision loss, hearing loss or change, chest pain, sob, hemoptysis, melena, hematochezia, hematuria, genital discharge or lesions, falls, bleeding or bruising, loc, thoughts of suicide or self harm, memory loss  Patient-completed extensive health risk assessment - reviewed and discussed with the patient: See Health Risk Assessment completed with patient prior to the visit either above or in recent phone note. This was reviewed in detailed with the patient today and appropriate recommendations, orders and referrals were placed as needed per Summary below and patient instructions.  Review of Medical History: -PMH, PSH,  Family History and current specialty and care providers reviewed and updated and listed below   Patient Care Team: Colon Branch, MD as PCP - General Wonda Horner, MD as Consulting Physician (Gastroenterology)   Past Medical History:  Diagnosis Date   Basal cell carcinoma 05/07/2015   R mid back    Basal cell carcinoma 04/17/2022   L shoulder, EDC by Margreta Journey [place] PA   Basal cell carcinoma 04/17/2022   Mid back, EDC by Christina [place]verstock PA [place]   Basal cell carcinoma 10/09/2022   Left inf med scapula - EDC   Basal cell carcinoma 10/09/2022   Left prox med calf - EDC   Hyperlipidemia 2007   Hypertension 2007   Inguinal hernia bilateral, non-recurrent    Normal cardiac stress test 2008   per pt   Squamous cell carcinoma of skin 10/09/2022   Right pectoral (in situ) - EDC    Past Surgical History:  Procedure Laterality Date   INGUINAL HERNIA REPAIR Bilateral 05/31/2018   Procedure: LAPAROSCOPIC BILATERAL INGUINAL HERNIA REPAIR ERAS PATHWAY;  Surgeon: Coralie Keens, MD;  Location: [facility];  Service: General;  Laterality: Bilateral;   INGUINAL HERNIA REPAIR Left 10/07/2018   Procedure: [place];  Surgeon: Coralie Keens, MD;  Location: [facility];  Service: General;  Laterality: Left;  TAP BLOCK   INSERTION OF MESH Bilateral 05/31/2018   Procedure: INSERTION OF MESH;  Surgeon: Coralie Keens, MD;  Location: [facility];  Service: General;  Laterality: Bilateral;   INSERTION OF MESH Left 10/07/2018   Procedure: INSERTION OF MESH;  Surgeon: Coralie Keens, MD;  Location: [facility];  Service: General;  Laterality: Left;    Social History   Socioeconomic History   Marital status: Divorced    Spouse name: Not on file   Number of children: 1   Years of education: Not on file   Highest education level: Not on file  Occupational History   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, hearing aid  Tobacco  Use   Smoking status: Never   Smokeless tobacco: Never  Vaping Use   Vaping Use: Never used  Substance and Sexual Activity   Alcohol use: Yes    Comment: socially   Drug use: No   Sexual activity: Yes  Other Topics Concern   Not on file  Social History Narrative   Original from [place]    Lives w/  girlfriend part time    Lost son ~ 2019 (OD)         Social Determinants of Health   Financial Resource Strain: Low Risk  (10/22/2021)   Overall Financial Resource Strain (CARDIA)    Difficulty of Paying Living Expenses: Not hard at all  Food Insecurity: No Food Insecurity (10/22/2021)   Hunger Vital Sign    Worried About Running Out of Food in the Last Year: Never true    [place] in the Last Year: Never true  Transportation Needs: No Transportation Needs (10/22/2021)   PRAPARE - Hydrologist (Medical): No    Lack of Transportation (Non-Medical): No  Physical Activity: Sufficiently Active (10/22/2021)   Exercise Vital Sign    Days of Exercise per Week: 4 days    Minutes of Exercise per Session: 60 min  Stress: No Stress Concern Present (10/22/2021)   [place]    Feeling of Stress :  Not at all  Social Connections: Socially Isolated (10/22/2021)   Social Connection and Isolation Panel [NHANES]    Frequency of Communication with Friends and Family: More than three times a week    Frequency of Social Gatherings with Friends and Family: More than three times a week    Attends Religious Services: Never    Marine scientist or Organizations: No    Attends Archivist Meetings: Never    Marital Status: Divorced  Human resources officer Violence: Not At Risk (10/22/2021)   Humiliation, Afraid, Rape, and Kick questionnaire    Fear of Current or Ex-Partner: No    Emotionally Abused: No    Physically Abused: No    Sexually Abused: No    Family History  Problem Relation Age of  Onset   Breast cancer Mother    Bone cancer Mother    Coronary artery disease Father        w/ first MI age 61, GF MI in the early 31s   Hypertension Sister    Macular degeneration Maternal Aunt    Prostate cancer Neg Hx    Colon cancer Neg Hx    Diabetes Neg Hx     Current Outpatient Medications on File Prior to Visit  Medication Sig Dispense Refill   aspirin 81 MG EC tablet Take 1 tablet (81 mg total) by mouth daily. Swallow whole.     atorvastatin (LIPITOR) 40 MG tablet Take 1 tablet (40 mg total) by mouth daily. 90 tablet 1   ezetimibe (ZETIA) 10 MG tablet Take 1 tablet (10 mg total) by mouth daily. 90 tablet 3   fluticasone (FLONASE) 50 MCG/ACT nasal spray Place 2 sprays into both nostrils daily as needed for allergies or rhinitis. 48 g 3   Ivermectin 1 % CREA Apply 1 Application topically at bedtime. Qhs to scalp 60 g 11   ketoconazole (NIZORAL) 2 % shampoo Apply 1 Application topically 2 (two) times a week. Wash scalp 2 times weekly, let sit 5 minutes and rinse out 120 mL 11   losartan (COZAAR) 100 MG tablet Take 1 tablet (100 mg total) by mouth daily. 90 tablet 1   mometasone (ELOCON) 0.1 % cream Apply 1 application topically as directed. Apply thin layer to the affected areas twice daily for up to two weeks 45 g 0   No current facility-administered medications on file prior to visit.    Allergies  Allergen Reactions   Penicillins     UNSPECIFIED CHILDHOOD REACTION  Has patient had a PCN reaction causing immediate rash, facial/tongue/throat swelling, SOB or lightheadedness with hypotension: Unknown Has patient had a PCN reaction causing severe rash involving mucus membranes or skin necrosis: Unknown Has patient had a PCN reaction that required hospitalization: No Has patient had a PCN reaction occurring within the last 10 years: No If all of the above answers are "NO", then may proceed with Cephalosporin use.        Physical Exam Vitals:   11/13/22 0847  BP: 125/74    Estimated body mass index is 28.06 kg/m as calculated from the following:   Height as of this encounter: _0  (1.753 m).   Weight as of this encounter: 190 lb (86.2 kg).  EKG (optional): deferred due to virtual visit  GENERAL: alert, oriented, appears well and in no acute distress; visual acuity grossly intact, full vision exam deferred due to pandemic and/or virtual encounter  HEENT: atraumatic, conjunttiva clear, no obvious abnormalities on inspection of external nose  and ears  NECK: normal movements of the head and neck  LUNGS: on inspection no signs of respiratory distress, breathing rate appears normal, no obvious gross SOB, gasping or wheezing  CV: no obvious cyanosis  MS: moves all visible extremities without noticeable abnormality  PSYCH/NEURO: pleasant and cooperative, no obvious depression or anxiety, speech and thought processing grossly intact, Cognitive function grossly intact  Flowsheet Row Video Visit from 11/13/2022 in [facility]  PHQ-9 Total Score 0           11/13/2022    8:50 AM 10/22/2021    9:07 AM 01/09/2021    1:43 PM 11/04/2019    1:56 PM 02/14/2019    8:53 AM  Depression screen PHQ 2/9  Decreased Interest 0 0 0 0 0  Down, Depressed, Hopeless 0 0 0 0 0  PHQ - 2 Score 0 0 0 0 0  Altered sleeping 0    0  Tired, decreased energy 0    0  Change in appetite 0    0  Feeling bad or failure about yourself  0    0  Trouble concentrating 0    0  Moving slowly or fidgety/restless 0    0  Suicidal thoughts 0    0  PHQ-9 Score 0    0  Difficult doing work/chores Not difficult at all    Not difficult at all       10/07/2018    7:27 AM 11/04/2019    1:57 PM 01/09/2021    1:42 PM 10/22/2021    9:06 AM 11/13/2022    8:49 AM  Fall Risk  Falls in the past year?  0 0 0 0  Was there an injury with Fall?   0 0 0  Fall Risk Category Calculator   0 0 0  Fall Risk Category   Low Low Low  Patient Fall Risk Level Low fall risk Low fall  risk Low fall risk Low fall risk   Fall risk Follow up  Falls evaluation completed Falls evaluation completed Falls prevention discussed Falls evaluation completed     SUMMARY AND PLAN:  Encounter for Medicare annual wellness exam  Discussed applicable health maintenance/preventive health measures and advised and referred or ordered per patient preferences:  Health Maintenance  Topic Date Due   COVID-19 Vaccine (3 - Pfizer risk series) 11/29/2022 (Originally 03/14/2020)   INFLUENZA VACCINE  03/15/2023 (Originally 07/15/2022)   DTaP/Tdap/Td (3 - Td or Tdap) 03/08/2023   Medicare Annual Wellness (AWV)  11/14/2023   COLONOSCOPY (Pts 45-60yr Insurance coverage will need to be confirmed)  05/21/2028   Pneumonia Vaccine 70 Years old  Completed   Hepatitis C Screening  Completed   Zoster Vaccines- Shingrix  Completed   HPV VACCINES  Aged Out  Prostate cancer screening per pt and PCP - already done recently. Had metabolic labs with PCP in last 1 year  Education and counseling on the following was provided based on the above review of health and a plan/checklist for the patient, along with additional information discussed, was provided for the patient in the patient instructions :  -Advised on importance of and resources for completing advanced directives - discussed at length, provided info in patient instructions -Advised and counseled on maintaining healthy weight and healthy lifestyle - including the importance of a health diet, regular physical activity, social connections and stress management. -Advised and counseled on a whole foods based healthy diet and regular exercise: discussed a heart healthy whole  foods based diet at length. A summary of a healthy diet was provided in the Patient Instructions. Recommended to continue regular exercise and guidelines provided in patient instructions.  -Advise yearly dental visits at minimum and regular eye exams -Advised and counseled on alcohol  limits, risks  Follow up: see patient instructions   Patient Instructions  I really enjoyed getting to talk with you today! I am available on Tuesdays and Thursdays for virtual visits if you have any questions or concerns, or if I can be of any further assistance.   CHECKLIST FROM ANNUAL WELLNESS VISIT:  -Follow up (please call to schedule if not scheduled after visit):  -Inperson visit with your Primary Doctor office: for physical yearly and per Dr. Ethel Rana recommendations -yearly for annual wellness visit with primary care office  Here is a list of your preventive care/health maintenance measures and the plan for each if any are due:  Health Maintenance  Topic Date Due   COVID-19 Vaccine (3 - Pfizer risk series) 03/14/2020   INFLUENZA VACCINE  07/15/2022   Medicare Annual Wellness (AWV)  10/22/2022   DTaP/Tdap/Td (3 - Td or Tdap) 03/08/2023   COLONOSCOPY (Pts 45-109yr Insurance coverage will need to be confirmed)  05/21/2028   Pneumonia Vaccine 71 Years old  Completed   Hepatitis C Screening  Completed   Zoster Vaccines- Shingrix  Completed   HPV VACCINES  Aged Out    -See a dentist at least yearly  -Other issues addressed today: -advanced directives - included information below -I have included below further information regarding a healthy whole foods based diet, physical activity guidelines for adults, stress management and opportunities for social connections. I hope you find this information useful.   -----------------------------------------------------------------------------------------------------------------------------------------------------------------------------------------------------------------------------------------------------------  ADVANCED HEALTHCARE DIRECTIVES:  Everyone should have advanced health care directives in place. This is so that you get the care you want, should you ever be in a situation where you are unable to make your own medical  decisions.   From the  Advanced Directive Website: "[place] legal documents in which you give written instructions about your health care if, in the future, you cannot speak for yourself.   A health care power of attorney allows you to name a person you trust to make your health care decisions if you cannot make them yourself. A declaration of a desire for a natural death (or living will) is document, which states that you desire not to have your life prolonged by extraordinary measures if you have a terminal or incurable illness or if you are in a vegetative state. An advance instruction for mental health treatment makes a declaration of instructions, information and preferences regarding your mental health treatment. It also states that you are aware that the advance instruction authorizes a mental health treatment provider to act according to your wishes. It may also outline your consent or refusal of mental health treatment. A declaration of an anatomical gift allows anyone over the age of 158to make a gift by will, organ donor card or other document."   Please see the following website or an elder law attorney for forms, FAQs and for completion of advanced directives: [email or web address]  Or copy and paste the following to your web browser: [email or web address]      FOOD - THE FUEL FOR A HAPPY HEALTHY LIFE: -eat real food: lots of colorful vegetables (half the plate) -5-7 servings of vegetables  and fruits per day (fresh or steamed is best), exp. 2 servings of vegetables with lunch and dinner and 2 servings of fruit per day. Berries and greens such as kale and collards are great choices.  -consume on a regular basis: whole grains (make sure first ingredient on label contains the word "whole"), fresh fruits, fish, nuts, seeds, healthy oils (such as olive oil, avocado  oil, grape seed oil) -may eat small amounts of dairy and lean meat on occasion, but avoid processed meats such as ham, bacon, lunch meat, etc. -drink water -try to avoid fast food and pre-packaged foods, processed meat -try to avoid foods that contain any ingredients with names you do not recognize  -try to avoid sugar/sweets (except for the natural sugar that occurs in fresh fruit) -try to avoid sweet drinks -try to avoid white rice, white bread, pasta (unless whole grain), white or yellow potatoes  MOVE - the key to keeping your body moving and working best: -if you wish to increase your physical activity, do so gradually and with the approval of your doctor -STOP and seek medical care immediately if you have any chest pain, chest discomfort or trouble breathing when starting or increasing exercise  -move and stretch your body, legs, feet and arms when sitting for long periods -Physical activity guidelines for optimal health in adults: -least 150 minutes per week of aerobic exercise (can talk, but not sing) once approved by your doctor, 20-30 minutes of sustained activity or two 10 minute episodes of sustained activity every day.  -resistance training at least 2 days per week if approved by your doctor -balance exercises 3+ days per week:   Stand somewhere where you have something sturdy to hold onto if you lose balance.    1) lift up on toes, start with 5x per day and work up to 20x   2) stand and lift on leg straight out to the side so that foot is a few inches of the floor, start with 5x each side and work up to 20x each side   3) stand on one foot, start with 5 seconds each side and work up to 20 seconds on each side  STRESS MANAGEMENT/Mindfulness - so important for health and well being -try meditating, or just sitting quietly with deep breathing while intentionally relaxing all parts of your body for 5 minutes daily             Lucretia Kern, DO

## 2022-11-13 NOTE — Patient Instructions (Addendum)
I really enjoyed getting to talk with you today! I am available on Tuesdays and Thursdays for virtual visits if you have any questions or concerns, or if I can be of any further assistance.   CHECKLIST FROM ANNUAL WELLNESS VISIT:  -Follow up (please call to schedule if not scheduled after visit):  -Inperson visit with your Primary Doctor office: for physical yearly and per Dr. Ethel Rana recommendations -yearly for annual wellness visit with primary care office  Here is a list of your preventive care/health maintenance measures and the plan for each if any are due:  Health Maintenance  Topic Date Due   COVID-19 Vaccine (3 - Pfizer risk series) 03/14/2020   INFLUENZA VACCINE  07/15/2022   Medicare Annual Wellness (AWV)  10/22/2022   DTaP/Tdap/Td (3 - Td or Tdap) 03/08/2023   COLONOSCOPY (Pts 45-46yr Insurance coverage will need to be confirmed)  05/21/2028   Pneumonia Vaccine 70 Years old  Completed   Hepatitis C Screening  Completed   Zoster Vaccines- Shingrix  Completed   HPV VACCINES  Aged Out    -See a dentist at least yearly  -Other issues addressed today: -advanced directives - included information below -I have included below further information regarding a healthy whole foods based diet, physical activity guidelines for adults, stress management and opportunities for social connections. I hope you find this information useful.     ADVANCED HEALTHCARE DIRECTIVES:  Everyone should have advanced health care directives in place. This is so that you get the care you want, should you ever be in a situation where you are unable to make your own medical decisions.   From the [place] Advanced Directive Website: "[place] are legal documents in which you give written instructions about your health care if, in the future, you cannot speak for yourself.   A health care power of attorney allows you to name a person you trust to make your health care decisions if you cannot make them yourself. A declaration of a desire for a natural death (or living will) is document, which states that you desire not to have your life prolonged by extraordinary measures if you have a terminal or incurable illness or if you are in a vegetative state. An advance instruction for mental health treatment makes a declaration of instructions, information and preferences regarding your mental health treatment. It also states that you are aware that the advance instruction authorizes a mental health treatment provider to act according to your wishes. It may also outline your consent or refusal of mental health treatment. A declaration of an anatomical gift allows anyone over the age of 70to make a gift by will, organ donor card or other document."   Please see the following website or an elder law attorney for forms, FAQs and for completion of advanced directives: [email or web address]  Or copy and paste the following to your web browser: [email or web address]      FOOD - THE FUEL FOR A HAPPY HEALTHY LIFE: -eat real food: lots of colorful vegetables (half the plate) -5-7 servings of vegetables and fruits per day (fresh or steamed is best), exp. 2 servings of vegetables with lunch and dinner and 2 servings of fruit per day. Berries and greens such as kale and collards are great choices.  -consume on a regular basis: whole grains (make sure first ingredient on label contains the word "whole"), fresh fruits, fish, nuts,  seeds, healthy oils (such as olive oil, avocado oil, grape seed oil) -may eat small amounts of dairy and lean meat on occasion, but  avoid processed meats such as ham, bacon, lunch meat, etc. -drink water -try to avoid fast food and pre-packaged foods, processed meat -try to avoid foods that contain any ingredients with names you do not recognize  -try to avoid sugar/sweets (except for the natural sugar that occurs in fresh fruit) -try to avoid sweet drinks -try to avoid white rice, white bread, pasta (unless whole grain), white or yellow potatoes  MOVE - the key to keeping your body moving and working best: -if you wish to increase your physical activity, do so gradually and with the approval of your doctor -STOP and seek medical care immediately if you have any chest pain, chest discomfort or trouble breathing when starting or increasing exercise  -move and stretch your body, legs, feet and arms when sitting for long periods -Physical activity guidelines for optimal health in adults: -least 150 minutes per week of aerobic exercise (can talk, but not sing) once approved by your doctor, 20-30 minutes of sustained activity or two 10 minute episodes of sustained activity every day.  -resistance training at least 2 days per week if approved by your doctor -balance exercises 3+ days per week:   Stand somewhere where you have something sturdy to hold onto if you lose balance.    1) lift up on toes, start with 5x per day and work up to 20x   2) stand and lift on leg straight out to the side so that foot is a few inches of the floor, start with 5x each side and work up to 20x each side   3) stand on one foot, start with 5 seconds each side and work up to 20 seconds on each side  STRESS MANAGEMENT/Mindfulness - so important for health and well being -try meditating, or just sitting quietly with deep breathing while intentionally relaxing all parts of your body for 5 minutes daily

## 2023-01-23 DIAGNOSIS — R69 Illness, unspecified: Secondary | ICD-10-CM | POA: Diagnosis not present

## 2023-01-26 ENCOUNTER — Telehealth: Payer: Self-pay | Admitting: Internal Medicine

## 2023-01-26 DIAGNOSIS — E785 Hyperlipidemia, unspecified: Secondary | ICD-10-CM

## 2023-01-26 DIAGNOSIS — I10 Essential (primary) hypertension: Secondary | ICD-10-CM

## 2023-01-26 DIAGNOSIS — E291 Testicular hypofunction: Secondary | ICD-10-CM

## 2023-01-26 DIAGNOSIS — Z0000 Encounter for general adult medical examination without abnormal findings: Secondary | ICD-10-CM

## 2023-01-26 NOTE — Telephone Encounter (Signed)
CMP FLP CBC A1c PSA If so desired, also total testosterone, needs to be a morning sample.  DX hypogonadism (testosterone was slightly low few years ago)

## 2023-01-26 NOTE — Telephone Encounter (Signed)
Orders placed. Mychart message sent to Pt.

## 2023-01-26 NOTE — Telephone Encounter (Signed)
Pt called stating that he would like to have his labs done prior to his CPE with Dr. Larose Kells and would like to have the orders put into the [facility] [place] lab, if possible. Pt would also like to have his Testosterone and PSA checked.

## 2023-01-26 NOTE — Telephone Encounter (Signed)
Please advise 

## 2023-01-29 ENCOUNTER — Other Ambulatory Visit (INDEPENDENT_AMBULATORY_CARE_PROVIDER_SITE_OTHER): Payer: Medicare HMO

## 2023-01-29 DIAGNOSIS — Z0000 Encounter for general adult medical examination without abnormal findings: Secondary | ICD-10-CM | POA: Diagnosis not present

## 2023-01-29 DIAGNOSIS — E291 Testicular hypofunction: Secondary | ICD-10-CM

## 2023-01-29 DIAGNOSIS — I10 Essential (primary) hypertension: Secondary | ICD-10-CM | POA: Diagnosis not present

## 2023-01-29 DIAGNOSIS — E785 Hyperlipidemia, unspecified: Secondary | ICD-10-CM | POA: Diagnosis not present

## 2023-01-29 LAB — CBC WITH DIFFERENTIAL/PLATELET
Basophils Absolute: 0 10*3/uL (ref 0.0–0.1)
Basophils Relative: 0.5 % (ref 0.0–3.0)
Eosinophils Absolute: 0.4 10*3/uL (ref 0.0–0.7)
Eosinophils Relative: 5.2 % — ABNORMAL HIGH (ref 0.0–5.0)
HCT: 41.8 % (ref 39.0–52.0)
Hemoglobin: 14.3 g/dL (ref 13.0–17.0)
Lymphocytes Relative: 33.6 % (ref 12.0–46.0)
Lymphs Abs: 2.3 10*3/uL (ref 0.7–4.0)
MCHC: 34.3 g/dL (ref 30.0–36.0)
MCV: 90.3 fl (ref 78.0–100.0)
Monocytes Absolute: 0.6 10*3/uL (ref 0.1–1.0)
Monocytes Relative: 9.5 % (ref 3.0–12.0)
Neutro Abs: 3.5 10*3/uL (ref 1.4–7.7)
Neutrophils Relative %: 51.2 % (ref 43.0–77.0)
Platelets: 205 10*3/uL (ref 150.0–400.0)
RBC: 4.63 Mil/uL (ref 4.22–5.81)
RDW: 13.2 % (ref 11.5–15.5)
WBC: 6.7 10*3/uL (ref 4.0–10.5)

## 2023-01-29 LAB — COMPREHENSIVE METABOLIC PANEL
ALT: 20 U/L (ref 0–53)
AST: 22 U/L (ref 0–37)
Albumin: 4 g/dL (ref 3.5–5.2)
Alkaline Phosphatase: 56 U/L (ref 39–117)
BUN: 18 mg/dL (ref 6–23)
CO2: 27 mEq/L (ref 19–32)
Calcium: 9.4 mg/dL (ref 8.4–10.5)
Chloride: 105 mEq/L (ref 96–112)
Creatinine, Ser: 0.94 mg/dL (ref 0.40–1.50)
GFR: 82 mL/min (ref >60.00)
Glucose, Bld: 88 mg/dL (ref 70–99)
Potassium: 4.6 mEq/L (ref 3.5–5.1)
Sodium: 142 mEq/L (ref 135–145)
Total Bilirubin: 0.8 mg/dL (ref 0.2–1.2)
Total Protein: 7.1 g/dL (ref 6.0–8.3)

## 2023-01-29 LAB — HEMOGLOBIN A1C: Hgb A1c MFr Bld: 5.5 % (ref 4.6–6.5)

## 2023-01-29 LAB — PSA: PSA: 0.81 ng/mL (ref 0.10–4.00)

## 2023-01-29 LAB — LIPID PANEL
Cholesterol: 140 mg/dL (ref 0–200)
HDL: 60.7 mg/dL (ref >39.00)
LDL Cholesterol: 66 mg/dL (ref 0–99)
NonHDL: 79.03
Total CHOL/HDL Ratio: 2
Triglycerides: 67 mg/dL (ref 0.0–149.0)
VLDL: 13.4 mg/dL (ref 0.0–40.0)

## 2023-01-29 LAB — TESTOSTERONE: Testosterone: 305.86 ng/dL (ref 300.00–890.00)

## 2023-02-03 DIAGNOSIS — R69 Illness, unspecified: Secondary | ICD-10-CM | POA: Diagnosis not present

## 2023-02-04 ENCOUNTER — Encounter: Payer: Self-pay | Admitting: Internal Medicine

## 2023-02-04 ENCOUNTER — Ambulatory Visit (INDEPENDENT_AMBULATORY_CARE_PROVIDER_SITE_OTHER): Payer: Medicare HMO | Admitting: Internal Medicine

## 2023-02-04 VITALS — BP 134/88 | HR 68 | Temp 97.6°F | Resp 16 | Ht 69.0 in | Wt 192.3 lb

## 2023-02-04 DIAGNOSIS — Z0000 Encounter for general adult medical examination without abnormal findings: Secondary | ICD-10-CM

## 2023-02-04 NOTE — Assessment & Plan Note (Signed)
Here for CPX HTN: Well-controlled on losartan, no change, ambulatory BP normal. Hyperlipidemia: On atorvastatin, Zetia was added, last LDL improved is at goal.  No change BCC: Sees dermatology regularly. RTC 1 year

## 2023-02-04 NOTE — Progress Notes (Signed)
Subjective:    Patient ID: Ralph [name], male    DOB: 03-03-1951, 72 y.o.   MRN: [id number]  DOS:  02/04/2023 Type of visit - description: CPX  Since the last office visit is doing well. Has no concerns or symptoms  Review of Systems   A 14 point review of systems is negative    Past Medical History:  Diagnosis Date   Basal cell carcinoma 05/07/2015   R mid back    Basal cell carcinoma 04/17/2022   L shoulder, EDC by Margreta Journey [place]verstock PA   Basal cell carcinoma 04/17/2022   Mid back, EDC by Christina Haverstock PA [place]   Basal cell carcinoma 10/09/2022   Left inf med scapula - EDC   Basal cell carcinoma 10/09/2022   Left prox med calf - EDC   Hyperlipidemia 2007   Hypertension 2007   Inguinal hernia bilateral, non-recurrent    Normal cardiac stress test 2008   per pt   Squamous cell carcinoma of skin 10/09/2022   Right pectoral (in situ) - EDC    Past Surgical History:  Procedure Laterality Date   INGUINAL HERNIA REPAIR Bilateral 05/31/2018   Procedure: LAPAROSCOPIC BILATERAL INGUINAL HERNIA REPAIR ERAS PATHWAY;  Surgeon: Coralie Keens, MD;  Location: [facility];  Service: General;  Laterality: Bilateral;   INGUINAL HERNIA REPAIR Left 10/07/2018   Procedure: [place];  Surgeon: Coralie Keens, MD;  Location: [place];  Service: General;  Laterality: Left;  TAP BLOCK   INSERTION OF MESH Bilateral 05/31/2018   Procedure: INSERTION OF MESH;  Surgeon: Coralie Keens, MD;  Location: [facility];  Service: General;  Laterality: Bilateral;   INSERTION OF MESH Left 10/07/2018   Procedure: INSERTION OF MESH;  Surgeon: Coralie Keens, MD;  Location: [place];  Service: General;  Laterality: Left;   Social History   Socioeconomic History   Marital status: Divorced    Spouse name: Not on file   Number of children: 1   Years of education: Not on file   Highest education level: Not on file   Occupational History   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, hearing aid  Tobacco Use   Smoking status: Never   Smokeless tobacco: Never  Vaping Use   Vaping Use: Never used  Substance and Sexual Activity   Alcohol use: Yes    Comment: socially   Drug use: No   Sexual activity: Yes  Other Topics Concern   Not on file  Social History Narrative   Original from [place]    Lives w/  girlfriend part time    Lost son ~ 2019 (OD)         Social Determinants of Health   Financial Resource Strain: Low Risk  (10/22/2021)   Overall Financial Resource Strain (CARDIA)    Difficulty of Paying Living Expenses: Not hard at all  Food Insecurity: No Food Insecurity (10/22/2021)   Hunger Vital Sign    Worried About Running Out of Food in the Last Year: Never true    [place] in the Last Year: Never true  Transportation Needs: No Transportation Needs (10/22/2021)   PRAPARE - Hydrologist (Medical): No    Lack of Transportation (Non-Medical): No  Physical Activity: Sufficiently Active (10/22/2021)   Exercise Vital Sign    Days of Exercise per Week: 4 days    Minutes of Exercise per Session: 60 min  Stress: No Stress Concern Present (  10/22/2021)   [employer] [place]    Feeling of Stress : Not at all  Social Connections: Socially Isolated (10/22/2021)   Social Connection and Isolation Panel [NHANES]    Frequency of Communication with Friends and Family: More than three times a week    Frequency of Social Gatherings with Friends and Family: More than three times a week    Attends Religious Services: Never    Marine scientist or Organizations: No    Attends Archivist Meetings: Never    Marital Status: Divorced  Human resources officer Violence: Not At Risk (10/22/2021)   Humiliation, Afraid, Rape, and Kick questionnaire    Fear of Current or Ex-Partner: No    Emotionally Abused: No    Physically  Abused: No    Sexually Abused: No     Current Outpatient Medications  Medication Instructions   aspirin EC 81 mg, Oral, Daily, Swallow whole.   atorvastatin (LIPITOR) 40 mg, Oral, Daily   ezetimibe (ZETIA) 10 mg, Oral, Daily   fluticasone (FLONASE) 50 MCG/ACT nasal spray 2 sprays, Each Nare, Daily PRN   Ivermectin 1 % CREA 1 Application, Apply externally, Daily at bedtime, Qhs to scalp   ketoconazole (NIZORAL) 2 % shampoo 1 Application, Topical, 2 times weekly, Wash scalp 2 times weekly, let sit 5 minutes and rinse out   losartan (COZAAR) 100 mg, Oral, Daily   mometasone (ELOCON) 0.1 % cream 1 application , Topical, As directed, Apply thin layer to the affected areas twice daily for up to two weeks       Objective:   Physical Exam BP 134/88   Pulse 68   Temp 97.6 F (36.4 C) (Oral)   Resp 16   Ht 5' 9"$  (1.753 m)   Wt 192 lb 5 oz (87.2 kg)   SpO2 97%   BMI 28.40 kg/m  General: Well developed, NAD, BMI noted Neck: No  thyromegaly  HEENT:  Normocephalic . Face symmetric, atraumatic Lungs:  CTA B Normal respiratory effort, no intercostal retractions, no accessory muscle use. Heart: RRR,  no murmur.  Abdomen:  Not distended, soft, non-tender. No rebound or rigidity.   Lower extremities: no pretibial edema bilaterally  Skin: Exposed areas without rash. Not pale. Not jaundice Neurologic:  alert & oriented X3.  Speech normal, gait appropriate for age and unassisted Strength symmetric and appropriate for age.  Psych: Cognition and judgment appear intact.  Cooperative with normal attention span and concentration.  Behavior appropriate. No anxious or depressed appearing.     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 Posterior speech is detaching R  BCC, rosacea - derm q year + FH CAD, coronary calcium score February 2023: 94.3  PLAN Here for CPX HTN: Well-controlled on losartan, no change, ambulatory BP  normal. Hyperlipidemia: On atorvastatin, Zetia was added, last LDL improved is at goal.  No change BCC: Sees dermatology regularly. RTC 1 year

## 2023-02-04 NOTE — Assessment & Plan Note (Signed)
-  Td 2014 --PNM 13: 2020.  PNM 23: 2022 - s/p zostavax and -shingrix x 2 -COVID vax booster rec - declines  - Flu shot q fall rec  --Prostate ca screening:   DRE normal last year, last PSA normal --Cscope 01-2006, saw [facility] 04/2018, Cscope June 06/2018, benign bx, 10 years per GI letter to the patient --Diet-exercise: Doing great, eats healthy, exercises regularly -Labs completed recently, normal and reviewed -Healthcare POA reviewed

## 2023-02-04 NOTE — Patient Instructions (Addendum)
Vaccines I recommend:  Tdap (tetanus) Covid booster RSV vaccine  Check the  blood pressure regularly BP GOAL is between 110/65 and  135/85. If it is consistently higher or lower, let me know   [place], [place] back for a  physical in 1 year     "[place] of attorney" ,  "Living will" (Advance care planning documents)  If you already have a living will or healthcare power of attorney, is recommended you bring the copy to be scanned in your chart.   The document will be available to all the doctors you see in the system.  Advance care planning is a process that supports adults in  understanding and sharing their preferences regarding future medical care.  The patient's preferences are recorded in documents called Advance Directives and the can be modified at any time while the patient is in full mental capacity.   If you don't have one, please consider create one.      More information at: [email or web address]

## 2023-02-09 DIAGNOSIS — R69 Illness, unspecified: Secondary | ICD-10-CM | POA: Diagnosis not present

## 2023-02-26 DIAGNOSIS — R69 Illness, unspecified: Secondary | ICD-10-CM | POA: Diagnosis not present

## 2023-03-06 DIAGNOSIS — R69 Illness, unspecified: Secondary | ICD-10-CM | POA: Diagnosis not present

## 2023-03-08 DIAGNOSIS — R69 Illness, unspecified: Secondary | ICD-10-CM | POA: Diagnosis not present

## 2023-03-12 ENCOUNTER — Encounter: Payer: Self-pay | Admitting: Internal Medicine

## 2023-03-17 DIAGNOSIS — R69 Illness, unspecified: Secondary | ICD-10-CM | POA: Diagnosis not present

## 2023-03-30 DIAGNOSIS — R69 Illness, unspecified: Secondary | ICD-10-CM | POA: Diagnosis not present

## 2023-04-03 ENCOUNTER — Other Ambulatory Visit: Payer: Self-pay | Admitting: Internal Medicine

## 2023-04-17 DIAGNOSIS — R69 Illness, unspecified: Secondary | ICD-10-CM | POA: Diagnosis not present

## 2023-04-27 DIAGNOSIS — R69 Illness, unspecified: Secondary | ICD-10-CM | POA: Diagnosis not present

## 2023-05-04 ENCOUNTER — Encounter: Payer: Self-pay | Admitting: Internal Medicine

## 2023-05-22 DIAGNOSIS — R69 Illness, unspecified: Secondary | ICD-10-CM | POA: Diagnosis not present

## 2023-06-03 DIAGNOSIS — R69 Illness, unspecified: Secondary | ICD-10-CM | POA: Diagnosis not present

## 2023-06-09 DIAGNOSIS — R69 Illness, unspecified: Secondary | ICD-10-CM | POA: Diagnosis not present

## 2023-06-10 ENCOUNTER — Other Ambulatory Visit: Payer: Self-pay | Admitting: Internal Medicine

## 2023-06-12 ENCOUNTER — Telehealth: Payer: Self-pay | Admitting: Internal Medicine

## 2023-06-12 NOTE — Telephone Encounter (Signed)
Pt called stating he was trying to get clarification on ow to take his losartan. Pt stated that he had two changes to his medication and recently, he had stopped taking it due to some dizziness he thought was related to the meds. Pt stated his BP was 120/78 around that time.

## 2023-06-12 NOTE — Telephone Encounter (Signed)
Spoke w/ Pt- informed of recommendations. Offered to schedule appt, he will call back, his brother in law Peyton Najjar, had a stroke recently, they just got him home this week- Pt would like to get him settled then will call to schedule appt. Pt will let us know if BP starts to trend back up.

## 2023-06-12 NOTE — Telephone Encounter (Signed)
Advise patient. Okay to hold losartan completely . Continue monitoring BPs. Let me know if he has noticed any bleeding, stomach pain or change in the color of the stools. Please arrange for office visit at the patient's earliest convenience.

## 2023-06-12 NOTE — Telephone Encounter (Signed)
Spoke w/ Pt- he was instructed to cut losartan 100mg  in 1/2 on 3/31 (see mychart message) d/t low bp readings. He self decreased even further to 1/4 tab of losartan 100mg  about 1 month ago, BP kept continuing to be low around 90s/70s. 2 days ago he quit losartan altogether- BP has been stable around 118/70 and 120/78 since doing so. He is wanting to know what he should do- remain off losartan or take every other day or if you have any other recommendations.

## 2023-06-12 NOTE — Addendum Note (Signed)
Addended byConrad Gorst D on: 06/12/2023 05:01 PM   Modules accepted: Orders

## 2023-06-16 DIAGNOSIS — R69 Illness, unspecified: Secondary | ICD-10-CM | POA: Diagnosis not present

## 2023-07-02 DIAGNOSIS — E785 Hyperlipidemia, unspecified: Secondary | ICD-10-CM | POA: Diagnosis not present

## 2023-07-02 DIAGNOSIS — I10 Essential (primary) hypertension: Secondary | ICD-10-CM | POA: Diagnosis not present

## 2023-07-02 DIAGNOSIS — Z8249 Family history of ischemic heart disease and other diseases of the circulatory system: Secondary | ICD-10-CM | POA: Diagnosis not present

## 2023-07-10 DIAGNOSIS — H524 Presbyopia: Secondary | ICD-10-CM | POA: Diagnosis not present

## 2023-09-30 ENCOUNTER — Telehealth: Payer: Self-pay | Admitting: Internal Medicine

## 2023-09-30 ENCOUNTER — Emergency Department (HOSPITAL_BASED_OUTPATIENT_CLINIC_OR_DEPARTMENT_OTHER)
Admission: EM | Admit: 2023-09-30 | Discharge: 2023-10-01 | Discharge status: Against Medical Advice | Payer: Medicare HMO | Attending: Emergency Medicine | Admitting: Emergency Medicine

## 2023-09-30 ENCOUNTER — Other Ambulatory Visit: Payer: Self-pay

## 2023-09-30 DIAGNOSIS — Z79899 Other long term (current) drug therapy: Secondary | ICD-10-CM | POA: Insufficient documentation

## 2023-09-30 DIAGNOSIS — I10 Essential (primary) hypertension: Secondary | ICD-10-CM | POA: Diagnosis not present

## 2023-09-30 DIAGNOSIS — R519 Headache, unspecified: Secondary | ICD-10-CM | POA: Diagnosis not present

## 2023-09-30 DIAGNOSIS — Z7982 Long term (current) use of aspirin: Secondary | ICD-10-CM | POA: Diagnosis not present

## 2023-09-30 NOTE — Telephone Encounter (Signed)
Please advise 

## 2023-09-30 NOTE — ED Triage Notes (Signed)
Reports HTN sys bp 180 at home. Denies CP SOB. No headaches vision changes. Stopped BP meds in July due to low BP and dizziness.

## 2023-09-30 NOTE — Telephone Encounter (Signed)
Spoke w/ Pt- informed of recommendations. Pt verbalized understanding. F/u scheduled for next week

## 2023-09-30 NOTE — Telephone Encounter (Signed)
Advised patient, go back on losartan 100 mg half tablet daily.  (50 mg daily). Send a prescription if needed. Monitor BPs. Schedule office as soon as he gets back home, hopefully next week.   Seek medical attention if chest pain, headache, shortness of breath.

## 2023-09-30 NOTE — Telephone Encounter (Signed)
Pt has a question about his blood pressure. He said that back in June he was down to just a quarter of his losartan pill and then in July his blood pressure was back within a normal range and taking even that little amount of medication was causing him to be dizzy/lightheaded so he stopped taking it altogether. Pt said his bp was normal for the last 3 months. On Sunday he noticed he had a headache and took his blood pressure. Pt said the bottom number has been going up but bottom number is still around 80. Today's bp was 164/80. Pt said he is going out of town tomorrow and wanted some guidance on this. Should he restart the losartan or do something else. Provider not available this week. Please call pt to advise.

## 2023-10-01 ENCOUNTER — Encounter: Payer: Self-pay | Admitting: Internal Medicine

## 2023-10-01 NOTE — ED Provider Notes (Signed)
[place] EMERGENCY DEPARTMENT AT [facility] Provider Note   CSN: [id number] Arrival date & time: 09/30/23  1844     History  Chief Complaint  Patient presents with   Hypertension    Asymptomatic     Ralph [name] is a 72 y.o. male.  The history is provided by the patient.  Patient history hypertension presents with elevated blood pressure.  Patient reports that he had previously been taken off blood pressure medicines due to low blood pressure.  However recently has been checking his blood pressure at home and has been elevated.  He contacted his PCP and has a follow-up arranged and he has restarted losartan daily.  However he continues to have elevated blood pressure.  He has had mild headache, no visual changes.  No chest pain or shortness of breath.  No focal weakness.  Patient reports multiple family members with cardiac disease and strokes and he is concerned about his own health     Home Medications Prior to Admission medications   Medication Sig Start Date End Date Taking? Authorizing Provider  aspirin 81 MG EC tablet Take 1 tablet (81 mg total) by mouth daily. Swallow whole. 01/08/22   Wanda Plump, MD  atorvastatin (LIPITOR) 40 MG tablet Take 1 tablet (40 mg total) by mouth daily. 04/03/23   Wanda Plump, MD  ezetimibe (ZETIA) 10 MG tablet Take 1 tablet (10 mg total) by mouth daily. 06/10/23   Wanda Plump, MD  fluticasone [facility]) 50 MCG/ACT nasal spray Place 2 sprays into both nostrils daily as needed for allergies or rhinitis. 04/16/20   Wanda Plump, MD  Ivermectin 1 % CREA Apply 1 Application topically at bedtime. Qhs to scalp 10/09/22   Deirdre Evener, MD  ketoconazole (NIZORAL) 2 % shampoo Apply 1 Application topically 2 (two) times a week. Wash scalp 2 times weekly, let sit 5 minutes and rinse out 10/09/22   Deirdre Evener, MD  losartan (COZAAR) 100 MG tablet Take 50 mg by mouth daily.    [provider]  mometasone (ELOCON) 0.1 % cream Apply 1  application topically as directed. Apply thin layer to the affected areas twice daily for up to two weeks 04/25/21   Deirdre Evener, MD      Allergies    Penicillins    Review of Systems   Review of Systems  Cardiovascular:  Negative for chest pain.  Gastrointestinal:  Negative for vomiting.  Neurological:  Positive for headaches. Negative for weakness.    Physical Exam Updated Vital Signs BP (!) 165/94   Pulse 70   Temp 98 F (36.7 C) (Oral)   Resp 18   Wt 86.2 kg   SpO2 100%   BMI 28.06 kg/m  Physical Exam CONSTITUTIONAL: Well developed/well nourished smiling, conversant no distress HEAD: Normocephalic/atraumatic ENMT: Mucous membranes moist NECK: supple no meningeal signs CV: S1/S2 noted, no murmurs/rubs/gallops noted LUNGS: Lungs are clear to auscultation bilaterally, no apparent distress NEURO: Pt is awake/alert/appropriate, moves all extremitiesx4.  No facial droop.  Patient ambulates without difficulty EXTREMITIES: pulses normal/equal, full ROM SKIN: warm, color normal PSYCH: no abnormalities of mood noted, alert and oriented to situation  ED Results / Procedures / Treatments   Labs (all labs ordered are listed, but only abnormal results are displayed) Labs Reviewed - No data to display  EKG None  Radiology No results found.  Procedures Procedures    Medications Ordered in ED Medications - No data to display  ED Course/ Medical Decision Making/ A&P                                 Medical Decision Making  Patient presents for asymptomatic elevated blood pressure.  He just restarted his losartan.  No signs of hypertensive emergency.  No indication for further workup. Patient will continue his medicines and follow-up with his PCP        Final Clinical Impression(s) / ED Diagnoses Final diagnoses:  Primary hypertension    Rx / DC Orders ED Discharge Orders     None         Zadie Rhine, MD 10/01/23 [id number]

## 2023-10-01 NOTE — ED Notes (Signed)
 RN reviewed discharge instructions with pt. Pt verbalized understanding and had no further questions. VSS upon discharge.  

## 2023-10-02 ENCOUNTER — Encounter: Payer: Self-pay | Admitting: Family

## 2023-10-02 ENCOUNTER — Ambulatory Visit (INDEPENDENT_AMBULATORY_CARE_PROVIDER_SITE_OTHER): Payer: Medicare HMO | Admitting: Family

## 2023-10-02 VITALS — BP 164/92 | HR 64 | Resp 18 | Ht 69.0 in | Wt 190.2 lb

## 2023-10-02 DIAGNOSIS — I10 Essential (primary) hypertension: Secondary | ICD-10-CM | POA: Diagnosis not present

## 2023-10-02 NOTE — Patient Instructions (Addendum)
Nice to meet you today-  Please keep your follow up with Dr. Drue Novel for next week; the Losartan needs a little more time to full effect; you and he can better make a decision next week about long term options; your EKG is normal today and very reassuring;

## 2023-10-02 NOTE — Progress Notes (Signed)
Ralph [name] is a 72 y.o. male with the following history as recorded in [employer]:  Patient Active Problem List   Diagnosis Date Noted   Basal cell carcinoma of back 06/12/2022   Basal cell carcinoma of left shoulder 06/12/2022   Family history of early CAD 01/09/2022   PCP NOTES >>>>>>>>>>>>>>>>>>>>> 03/11/2016   Tinnitus 03/07/2013   Erectile dysfunction 02/04/2012   Annual physical exam 04/15/2011   Hyperlipidemia 03/25/2010   Essential hypertension 03/25/2010    Current Outpatient Medications  Medication Sig Dispense Refill   aspirin 81 MG EC tablet Take 1 tablet (81 mg total) by mouth daily. Swallow whole.     atorvastatin (LIPITOR) 40 MG tablet Take 1 tablet (40 mg total) by mouth daily. 90 tablet 3   ezetimibe (ZETIA) 10 MG tablet Take 1 tablet (10 mg total) by mouth daily. 90 tablet 2   fluticasone (FLONASE) 50 MCG/ACT nasal spray Place 2 sprays into both nostrils daily as needed for allergies or rhinitis. 48 g 3   Ivermectin 1 % CREA Apply 1 Application topically at bedtime. Qhs to scalp 60 g 11   ketoconazole (NIZORAL) 2 % shampoo Apply 1 Application topically 2 (two) times a week. Wash scalp 2 times weekly, let sit 5 minutes and rinse out 120 mL 11   losartan (COZAAR) 100 MG tablet Take 50 mg by mouth daily.     mometasone (ELOCON) 0.1 % cream Apply 1 application topically as directed. Apply thin layer to the affected areas twice daily for up to two weeks 45 g 0   No current facility-administered medications for this visit.    Allergies: Penicillins  Past Medical History:  Diagnosis Date   Basal cell carcinoma 05/07/2015   R mid back    Basal cell carcinoma 04/17/2022   L shoulder, EDC by Trula Ore Haverstock PA   Basal cell carcinoma 04/17/2022   Mid back, EDC by Christina Haverstock PA [place]   Basal cell carcinoma 10/09/2022   Left inf med scapula - EDC   Basal cell carcinoma 10/09/2022   Left prox med calf - EDC   Hyperlipidemia 2007   Hypertension 2007    Inguinal hernia bilateral, non-recurrent    Normal cardiac stress test 2008   per pt   Squamous cell carcinoma of skin 10/09/2022   Right pectoral (in situ) - EDC    Past Surgical History:  Procedure Laterality Date   INGUINAL HERNIA REPAIR Bilateral 05/31/2018   Procedure: LAPAROSCOPIC BILATERAL INGUINAL HERNIA REPAIR ERAS PATHWAY;  Surgeon: Abigail Miyamoto, MD;  Location: [facility];  Service: General;  Laterality: Bilateral;   INGUINAL HERNIA REPAIR Left 10/07/2018   Procedure: OPEN LEFT INGUINAL HERNIA REPAIR ERAS PATHWAY;  Surgeon: Abigail Miyamoto, MD;  Location: [facility] OR;  Service: General;  Laterality: Left;  TAP BLOCK   INSERTION OF MESH Bilateral 05/31/2018   Procedure: INSERTION OF MESH;  Surgeon: Abigail Miyamoto, MD;  Location: [facility];  Service: General;  Laterality: Bilateral;   INSERTION OF MESH Left 10/07/2018   Procedure: INSERTION OF MESH;  Surgeon: Abigail Miyamoto, MD;  Location: [facility] OR;  Service: General;  Laterality: Left;    Family History  Problem Relation Age of Onset   Breast cancer Mother    Bone cancer Mother    Coronary artery disease Father        w/ first MI age 5, GF MI in the early 16s   Hypertension Sister    Macular degeneration Maternal Aunt  Prostate cancer Neg Hx    Colon cancer Neg Hx    Diabetes Neg Hx     Social History   Tobacco Use   Smoking status: Never   Smokeless tobacco: Never  Substance Use Topics   Alcohol use: Yes    Comment: socially    Subjective:   Patient went to the ER 2 days ago with concerns for elevated blood pressure- had previously been on Losartan but stopped on his own accord 3-4 months ago due to concerns for hypotension; is currently back on medication today; Denies any chest pain, shortness of breath, blurred vision or headache;    Objective:  Vitals:   10/02/23 0804  BP: (!) 164/92  Pulse: 64  Resp: 18  SpO2: 98%  Weight: 190 lb 3.2 oz (86.3 kg)  Height: 5\' 9"   (1.753 m)    General: Well developed, well nourished, in no acute distress  Skin : Warm and dry.  Head: Normocephalic and atraumatic  Eyes: Sclera and conjunctiva clear; pupils round and reactive to light; extraocular movements intact  Ears: External normal; canals clear; tympanic membranes normal  Oropharynx: Pink, supple. No suspicious lesions  Neck: Supple without thyromegaly, adenopathy  Lungs: Respirations unlabored; clear to auscultation bilaterally without wheeze, rales, rhonchi  CVS exam: normal rate and regular rhythm.  Neurologic: Alert and oriented; speech intact; face symmetrical; moves all extremities well; CNII-XII intact without focal deficit   Assessment:  1. Primary hypertension     Plan:  EKG shows NSR; agree with current decision to re-start Losartan 50 mg daily; keep planned follow up with his PCP for next week;   No follow-ups on file.  Orders Placed This Encounter  Procedures   EKG 12-Lead    Requested Prescriptions    No prescriptions requested or ordered in this encounter

## 2023-10-07 ENCOUNTER — Ambulatory Visit: Payer: Medicare HMO | Admitting: Internal Medicine

## 2023-10-09 ENCOUNTER — Encounter: Payer: Self-pay | Admitting: Internal Medicine

## 2023-10-09 ENCOUNTER — Ambulatory Visit (INDEPENDENT_AMBULATORY_CARE_PROVIDER_SITE_OTHER): Payer: Medicare HMO | Admitting: Internal Medicine

## 2023-10-09 VITALS — BP 138/80 | HR 74 | Temp 98.0°F | Resp 16 | Ht 69.0 in | Wt 191.4 lb

## 2023-10-09 DIAGNOSIS — I10 Essential (primary) hypertension: Secondary | ICD-10-CM | POA: Diagnosis not present

## 2023-10-09 LAB — BASIC METABOLIC PANEL
BUN: 13 mg/dL (ref 6–23)
CO2: 31 meq/L (ref 19–32)
Calcium: 9.5 mg/dL (ref 8.4–10.5)
Chloride: 101 meq/L (ref 96–112)
Creatinine, Ser: 0.98 mg/dL (ref 0.40–1.50)
GFR: 77.62 mL/min (ref >60.00)
Glucose, Bld: 145 mg/dL — ABNORMAL HIGH (ref 70–99)
Potassium: 4.9 meq/L (ref 3.5–5.1)
Sodium: 139 meq/L (ref 135–145)

## 2023-10-09 NOTE — Assessment & Plan Note (Signed)
HTN: At some point was on metoprolol, amlodipine and losartan. Medications were gradually reduced.  Eventually stopped losartan ~ 05/2023. Few days felt slightly dizzy, checked his BP, was elevated, thus restarted losartan 100 g half tablet every day.  At this point he is on losartan 50 mg ambulatory systolic BP yesterday 135, recheck 114. BP today 138/80. Plan: Continue losartan 50 mg daily, check ambulatory BPs, BMP today.  Follow-up scheduled for 01-2024.  See AVS. Declined a flu shot today.

## 2023-10-09 NOTE — Patient Instructions (Addendum)
Continue taking half of the losartan.  Check the  blood pressure regularly Blood pressure goal:  between 110/65 and  135/85. If it is consistently higher or lower, let me know    GO TO THE LAB : Get the blood work    See you in February for your physical.  Call sooner if needed.   Vaccines I recommend: Covid booster Tdap (tetanus) RSV vaccine Flu shot

## 2023-10-09 NOTE — Progress Notes (Signed)
Subjective:    Patient ID: Ralph [name], male    DOB: 1951/03/25, 72 y.o.   MRN: [id number]  DOS:  10/09/2023 Type of visit - description: BP Management   See phone note from 09/30/2023, BP was going up, was rec to go back to losartan but only 50 mg daily. He went to the ER the same day, BP was 165/94  Eventually seen here by Vernona Rieger, BP was 164/92.  At this point, he is asymptomatic. No headaches.  No chest pain or difficulty breathing.  No edema. BP yesterday at home was okay.    Review of Systems See above   Past Medical History:  Diagnosis Date   Basal cell carcinoma 05/07/2015   R mid back    Basal cell carcinoma 04/17/2022   L shoulder, EDC by Trula Ore Haverstock PA   Basal cell carcinoma 04/17/2022   Mid back, EDC by Christina Haverstock PA [place]   Basal cell carcinoma 10/09/2022   Left inf med scapula - EDC   Basal cell carcinoma 10/09/2022   Left prox med calf - EDC   Hyperlipidemia 2007   Hypertension 2007   Inguinal hernia bilateral, non-recurrent    Normal cardiac stress test 2008   per pt   Squamous cell carcinoma of skin 10/09/2022   Right pectoral (in situ) - EDC    Past Surgical History:  Procedure Laterality Date   INGUINAL HERNIA REPAIR Bilateral 05/31/2018   Procedure: LAPAROSCOPIC BILATERAL INGUINAL HERNIA REPAIR ERAS PATHWAY;  Surgeon: Abigail Miyamoto, MD;  Location: [facility];  Service: General;  Laterality: Bilateral;   INGUINAL HERNIA REPAIR Left 10/07/2018   Procedure: OPEN LEFT INGUINAL HERNIA REPAIR ERAS PATHWAY;  Surgeon: Abigail Miyamoto, MD;  Location: [facility] OR;  Service: General;  Laterality: Left;  TAP BLOCK   INSERTION OF MESH Bilateral 05/31/2018   Procedure: INSERTION OF MESH;  Surgeon: Abigail Miyamoto, MD;  Location: [facility];  Service: General;  Laterality: Bilateral;   INSERTION OF MESH Left 10/07/2018   Procedure: INSERTION OF MESH;  Surgeon: Abigail Miyamoto, MD;  Location: [facility] OR;   Service: General;  Laterality: Left;    Current Outpatient Medications  Medication Instructions   aspirin EC 81 mg, Oral, Daily, Swallow whole.   atorvastatin (LIPITOR) 40 mg, Oral, Daily   ezetimibe (ZETIA) 10 mg, Oral, Daily   fluticasone (FLONASE) 50 MCG/ACT nasal spray 2 sprays, Each Nare, Daily PRN   Ivermectin 1 % CREA 1 Application, Apply externally, Daily at bedtime, Qhs to scalp   ketoconazole (NIZORAL) 2 % shampoo 1 Application, Topical, 2 times weekly, Wash scalp 2 times weekly, let sit 5 minutes and rinse out   losartan (COZAAR) 50 mg, Oral, Daily   mometasone (ELOCON) 0.1 % cream 1 application , Topical, As directed, Apply thin layer to the affected areas twice daily for up to two weeks       Objective:   Physical Exam BP 138/80   Pulse 74   Temp 98 F (36.7 C) (Oral)   Resp 16   Ht 5\' 9"  (1.753 m)   Wt 191 lb 6 oz (86.8 kg)   SpO2 97%   BMI 28.26 kg/m  General:   Well developed, NAD, BMI noted. HEENT:  Normocephalic . Face symmetric, atraumatic Lungs:  CTA B Normal respiratory effort, no intercostal retractions, no accessory muscle use. Heart: RRR,  no murmur.  Lower extremities: no pretibial edema bilaterally  Skin: Not pale. Not jaundice Neurologic:  alert & oriented  X3.  Speech normal, gait appropriate for age and unassisted Psych--  Cognition and judgment appear intact.  Cooperative with normal attention span and concentration.  Behavior appropriate. No anxious or depressed appearing.      Assessment    Assessment HTN Hyperlipidemia Tinnitus (used to take xanas very rarely) E.D. Posterior speech is detaching R  BCC, rosacea - derm q year + FH CAD, coronary calcium score February 2023: 94.3  PLAN HTN: At some point was on metoprolol, amlodipine and losartan. Medications were gradually reduced.  Eventually stopped losartan ~ 05/2023. Few days felt slightly dizzy, checked his BP, was elevated, thus restarted losartan 100 g half tablet every  day.  At this point he is on losartan 50 mg ambulatory systolic BP yesterday 135, recheck 114. BP today 138/80. Plan: Continue losartan 50 mg daily, check ambulatory BPs, BMP today.  Follow-up scheduled for 01-2024.  See AVS. Declined a flu shot today.

## 2023-10-15 ENCOUNTER — Ambulatory Visit: Payer: Medicare HMO | Admitting: Dermatology

## 2023-10-15 DIAGNOSIS — Z79899 Other long term (current) drug therapy: Secondary | ICD-10-CM

## 2023-10-15 DIAGNOSIS — D485 Neoplasm of uncertain behavior of skin: Secondary | ICD-10-CM

## 2023-10-15 DIAGNOSIS — L578 Other skin changes due to chronic exposure to nonionizing radiation: Secondary | ICD-10-CM

## 2023-10-15 DIAGNOSIS — L821 Other seborrheic keratosis: Secondary | ICD-10-CM | POA: Diagnosis not present

## 2023-10-15 DIAGNOSIS — C44311 Basal cell carcinoma of skin of nose: Secondary | ICD-10-CM | POA: Diagnosis not present

## 2023-10-15 DIAGNOSIS — W908XXA Exposure to other nonionizing radiation, initial encounter: Secondary | ICD-10-CM | POA: Diagnosis not present

## 2023-10-15 DIAGNOSIS — Z85828 Personal history of other malignant neoplasm of skin: Secondary | ICD-10-CM

## 2023-10-15 DIAGNOSIS — L719 Rosacea, unspecified: Secondary | ICD-10-CM

## 2023-10-15 DIAGNOSIS — L570 Actinic keratosis: Secondary | ICD-10-CM | POA: Diagnosis not present

## 2023-10-15 DIAGNOSIS — L820 Inflamed seborrheic keratosis: Secondary | ICD-10-CM | POA: Diagnosis not present

## 2023-10-15 DIAGNOSIS — D492 Neoplasm of unspecified behavior of bone, soft tissue, and skin: Secondary | ICD-10-CM

## 2023-10-15 DIAGNOSIS — L739 Follicular disorder, unspecified: Secondary | ICD-10-CM

## 2023-10-15 DIAGNOSIS — Z7189 Other specified counseling: Secondary | ICD-10-CM

## 2023-10-15 NOTE — Patient Instructions (Addendum)

## 2023-10-15 NOTE — Progress Notes (Signed)
Follow-Up Visit   Subjective  Ralph [name] is a 72 y.o. male who presents for the following: Rosacea and folliculitis of the face and scalp, pt currently using Ketoconazole 2% shampoo and [employer] rosacea mix which does help flares, but doesn't complete resolve condition.   The patient has spots, moles and lesions to be evaluated, some may be new or changing and the patient may have concern these could be cancer.   The following portions of the chart were reviewed this encounter and updated as appropriate: medications, allergies, medical history  Review of Systems:  No other skin or systemic complaints except as noted in HPI or Assessment and Plan.  Objective  Well appearing patient in no apparent distress; mood and affect are within normal limits.   A focused examination was performed of the following areas: the face, scalp, arms, and hands   Relevant exam findings are noted in the Assessment and Plan.  Scalp/face/ears x 9 (9) Pink scaly macules  L forearm x 1 Erythematous stuck-on, waxy papule or plaque  R nose 1.0 x 0.7 cm slightly indurated irregular textured patch       Assessment & Plan   Actinic Damage - chronic, secondary to cumulative UV radiation exposure/sun exposure over time - diffuse scaly erythematous macules with underlying dyspigmentation - Recommend daily broad spectrum sunscreen SPF 30+ to sun-exposed areas, reapply every 2 hours as needed.  - Recommend staying in the shade or wearing long sleeves, sun glasses (UVA+UVB protection) and wide brim hats (4-inch brim around the entire circumference of the hat). - Call for new or changing lesions.    Seborrheic Keratoses - Stuck-on, waxy, tan-brown papules and/or plaques  - Benign-appearing - Discussed benign etiology and prognosis. - Observe - Call for any changes   History of Basal Cell Carcinoma of the Skin - No evidence of recurrence today - Recommend regular full body skin exams -  Recommend daily broad spectrum sunscreen SPF 30+ to sun-exposed areas, reapply every 2 hours as needed.  - Call if any new or changing lesions are noted between office visits  - L top of shoulder, R mid back, Mid back   Rosacea /folliculitis Scalp Chronic and persistent condition with duration or expected duration over one year. Condition is symptomatic / bothersome to patient. Not to goal.  Rosacea is a chronic progressive skin condition usually affecting the face of adults, causing redness and/or acne bumps. It is treatable but not curable. It sometimes affects the eyes (ocular rosacea) as well. It may respond to topical and/or systemic medication and can flare with stress, sun exposure, alcohol, exercise, topical steroids (including hydrocortisone/cortisone 10) and some foods.  Daily application of broad spectrum spf 30+ sunscreen to face is recommended to reduce flares.   Cont Ketoconazole 2% cr 2-3x/wk, let sit 5 minutes and rinse out Cont SM Triple cream qhs to scalp  Discussed Doxycycline, pt defers today.   AK (actinic keratosis) (9) Scalp/face/ears x 9  Actinic keratoses are precancerous spots that appear secondary to cumulative UV radiation exposure/sun exposure over time. They are chronic with expected duration over 1 year. A portion of actinic keratoses will progress to squamous cell carcinoma of the skin. It is not possible to reliably predict which spots will progress to skin cancer and so treatment is recommended to prevent development of skin cancer.  Recommend daily broad spectrum sunscreen SPF 30+ to sun-exposed areas, reapply every 2 hours as needed.  Recommend staying in the shade or wearing long  sleeves, sun glasses (UVA+UVB protection) and wide brim hats (4-inch brim around the entire circumference of the hat). Call for new or changing lesions.   Destruction of lesion - Scalp/face/ears x 9 (9) Complexity: simple   Destruction method: cryotherapy   Informed consent:  discussed and consent obtained   Timeout:  patient name, date of birth, surgical site, and procedure verified Lesion destroyed using liquid nitrogen: Yes   Region frozen until ice ball extended beyond lesion: Yes   Outcome: patient tolerated procedure well with no complications   Post-procedure details: wound care instructions given    Inflamed seborrheic keratosis L forearm x 1  Symptomatic, irritating, patient would like treated.   Destruction of lesion - L forearm x 1 Complexity: simple   Destruction method: cryotherapy   Informed consent: discussed and consent obtained   Timeout:  patient name, date of birth, surgical site, and procedure verified Lesion destroyed using liquid nitrogen: Yes   Region frozen until ice ball extended beyond lesion: Yes   Outcome: patient tolerated procedure well with no complications   Post-procedure details: wound care instructions given    Neoplasm of uncertain behavior of skin R nose  Skin / nail biopsy Type of biopsy: tangential   Informed consent: discussed and consent obtained   Timeout: patient name, date of birth, surgical site, and procedure verified   Procedure prep:  Patient was prepped and draped in usual sterile fashion Prep type:  Isopropyl alcohol Anesthesia: the lesion was anesthetized in a standard fashion   Anesthetic:  1% lidocaine w/ epinephrine 1-100,000 buffered w/ 8.4% NaHCO3 Instrument used: flexible razor blade   Hemostasis achieved with: pressure, aluminum chloride and electrodesiccation   Outcome: patient tolerated procedure well   Post-procedure details: sterile dressing applied and wound care instructions given   Dressing type: bandage and petrolatum    Specimen 1 - Surgical pathology Differential Diagnosis: D48.5 r/o BCC Check Margins: No Very small specimen  HISTORY OF SQUAMOUS CELL CARCINOMA OF THE SKIN - No evidence of recurrence today - No lymphadenopathy - Recommend regular full body skin exams -  Recommend daily broad spectrum sunscreen SPF 30+ to sun-exposed areas, reapply every 2 hours as needed.  - Call if any new or changing lesions are noted between office visits  HISTORY OF BASAL CELL CARCINOMA OF THE SKIN - No evidence of recurrence today - Recommend regular full body skin exams - Recommend daily broad spectrum sunscreen SPF 30+ to sun-exposed areas, reapply every 2 hours as needed.  - Call if any new or changing lesions are noted between office visits  Return in about 1 year (around 10/14/2024) for TBSE.  Maylene Roes, CMA, am acting as scribe for Armida Sans, MD .   Documentation: I have reviewed the above documentation for accuracy and completeness, and I agree with the above.  Armida Sans, MD

## 2023-10-16 HISTORY — PX: MOHS SURGERY: SUR867

## 2023-10-19 ENCOUNTER — Other Ambulatory Visit: Payer: Self-pay

## 2023-10-19 MED ORDER — DOXYCYCLINE HYCLATE 50 MG PO CAPS: ORAL_CAPSULE | ORAL | 3 refills | Status: DC

## 2023-10-19 NOTE — Progress Notes (Signed)
Pt requesting prescription PRN flares.

## 2023-10-20 ENCOUNTER — Encounter: Payer: Self-pay | Admitting: Dermatology

## 2023-10-20 LAB — SURGICAL PATHOLOGY

## 2023-10-22 ENCOUNTER — Telehealth: Payer: Self-pay

## 2023-10-22 DIAGNOSIS — C44311 Basal cell carcinoma of skin of nose: Secondary | ICD-10-CM

## 2023-10-22 NOTE — Telephone Encounter (Signed)
Left pt msg to call for bx results/sh 

## 2023-10-22 NOTE — Telephone Encounter (Signed)
-----   Message from Armida Sans sent at 10/20/2023  6:15 PM EST ----- FINAL DIAGNOSIS        1. Skin, R nose :       BASAL CELL CARCINOMA, NODULAR PATTERN   Cancer = BCC Schedule for [facility]

## 2023-10-26 NOTE — Addendum Note (Signed)
Addended by: Dorathy Daft R on: 10/26/2023 09:16 AM   Modules accepted: Orders

## 2023-10-26 NOTE — Telephone Encounter (Signed)
Patient advised of BX results and referral sent to [facility]. aw

## 2023-11-04 ENCOUNTER — Encounter: Payer: Self-pay | Admitting: Dermatology

## 2023-11-05 DIAGNOSIS — C44311 Basal cell carcinoma of skin of nose: Secondary | ICD-10-CM | POA: Diagnosis not present

## 2023-11-06 ENCOUNTER — Telehealth: Payer: Self-pay

## 2023-11-06 NOTE — Telephone Encounter (Signed)
[facility] progress notes in media. Aw History and specimen tracking updated

## 2023-12-03 ENCOUNTER — Telehealth: Payer: Self-pay

## 2023-12-03 NOTE — Telephone Encounter (Signed)
History and speicmen tracking updated from [facility] progress notes of R nose. aw

## 2024-01-07 ENCOUNTER — Ambulatory Visit (INDEPENDENT_AMBULATORY_CARE_PROVIDER_SITE_OTHER): Payer: Medicare Other

## 2024-01-07 VITALS — Ht 69.0 in | Wt 191.0 lb

## 2024-01-07 DIAGNOSIS — Z2821 Immunization not carried out because of patient refusal: Secondary | ICD-10-CM

## 2024-01-07 DIAGNOSIS — Z0000 Encounter for general adult medical examination without abnormal findings: Secondary | ICD-10-CM | POA: Diagnosis not present

## 2024-01-07 NOTE — Progress Notes (Signed)
Because this visit was a virtual/telehealth visit,  certain criteria was not obtained, such a blood pressure, CBG if applicable, and timed get up and go. Any medications not marked as "taking" were not mentioned during the medication reconciliation part of the visit. Any vitals not documented were not able to be obtained due to this being a telehealth visit or patient was unable to self-report a recent blood pressure reading due to a lack of equipment at home via telehealth. Vitals that have been documented are verbally provided by the patient.  Interactive audio and video telecommunications were attempted between this provider and patient, however failed, due to patient having technical difficulties OR patient did not have access to video capability.  We continued and completed visit with audio only.  Subjective:   Ralph [name] is a 73 y.o. male who presents for Medicare Annual/Subsequent preventive examination.  Visit Complete: Virtual I connected with  Ralph [name] on 01/07/24 by a audio enabled telemedicine application and verified that I am speaking with the correct person using two identifiers.  Patient Location: Home  Provider Location: Home Office  I discussed the limitations of evaluation and management by telemedicine. The patient expressed understanding and agreed to proceed.  Vital Signs: Because this visit was a virtual/telehealth visit, some criteria may be missing or patient reported. Any vitals not documented were not able to be obtained and vitals that have been documented are patient reported.  Patient Medicare AWV questionnaire was completed by the patient on 01/07/2024; I have confirmed that all information answered by patient is correct and no changes since this date.  Cardiac Risk Factors include: advanced age (>98men, >48 women);dyslipidemia;hypertension;male gender     Objective:    Today's Vitals   01/07/24 0847  Weight: 191 lb (86.6 kg)  Height: 5\' 9"  (1.753 m)    Body mass index is 28.21 kg/m.     01/07/2024    8:48 AM 09/30/2023    8:57 PM 10/22/2021    9:04 AM 10/04/2018    3:06 PM 05/31/2018    1:45 PM 05/28/2018    2:54 PM  Advanced Directives  Does Patient Have a Medical Advance Directive? No No No No No No  Does patient want to make changes to medical advance directive?    No - Patient declined    Would patient like information on creating a medical advance directive? No - Patient declined  No - Patient declined No - Patient declined No - Patient declined No - Patient declined    Current Medications (verified) Outpatient Encounter Medications as of 01/07/2024  Medication Sig   aspirin 81 MG EC tablet Take 1 tablet (81 mg total) by mouth daily. Swallow whole.   atorvastatin (LIPITOR) 40 MG tablet Take 1 tablet (40 mg total) by mouth daily.   doxycycline (VIBRAMYCIN) 50 MG capsule Take one po QD for pimples. Take with food and plenty of drink.   ezetimibe (ZETIA) 10 MG tablet Take 1 tablet (10 mg total) by mouth daily.   fluticasone (FLONASE) 50 MCG/ACT nasal spray Place 2 sprays into both nostrils daily as needed for allergies or rhinitis.   Ivermectin 1 % CREA Apply 1 Application topically at bedtime. Qhs to scalp   ketoconazole (NIZORAL) 2 % shampoo Apply 1 Application topically 2 (two) times a week. Wash scalp 2 times weekly, let sit 5 minutes and rinse out   losartan (COZAAR) 100 MG tablet Take 50 mg by mouth daily.   mometasone (ELOCON) 0.1 % cream  Apply 1 application topically as directed. Apply thin layer to the affected areas twice daily for up to two weeks   No facility-administered encounter medications on file as of 01/07/2024.    Allergies (verified) Penicillins   History: Past Medical History:  Diagnosis Date   Basal cell carcinoma 05/07/2015   R mid back    Basal cell carcinoma 04/17/2022   L shoulder, EDC by Trula Ore Haverstock PA   Basal cell carcinoma 04/17/2022   Mid back, EDC by Christina Haverstock PA  [place]   Basal cell carcinoma 10/09/2022   Left inf med scapula - EDC   Basal cell carcinoma 10/09/2022   Left prox med calf - EDC   Basal cell carcinoma 10/15/2023   R nose, MOHs completed 11/05/23   Hyperlipidemia 2007   Hypertension 2007   Inguinal hernia bilateral, non-recurrent    Normal cardiac stress test 2008   per pt   Squamous cell carcinoma of skin 10/09/2022   Right pectoral (in situ) - EDC   Past Surgical History:  Procedure Laterality Date   INGUINAL HERNIA REPAIR Bilateral 05/31/2018   Procedure: LAPAROSCOPIC BILATERAL INGUINAL HERNIA REPAIR ERAS PATHWAY;  Surgeon: Abigail Miyamoto, MD;  Location: [facility];  Service: General;  Laterality: Bilateral;   INGUINAL HERNIA REPAIR Left 10/07/2018   Procedure: OPEN LEFT INGUINAL HERNIA REPAIR ERAS PATHWAY;  Surgeon: Abigail Miyamoto, MD;  Location: [facility];  Service: General;  Laterality: Left;  TAP BLOCK   INSERTION OF MESH Bilateral 05/31/2018   Procedure: INSERTION OF MESH;  Surgeon: Abigail Miyamoto, MD;  Location: [facility];  Service: General;  Laterality: Bilateral;   INSERTION OF MESH Left 10/07/2018   Procedure: INSERTION OF MESH;  Surgeon: Abigail Miyamoto, MD;  Location: [facility] OR;  Service: General;  Laterality: Left;   Family History  Problem Relation Age of Onset   Breast cancer Mother    Bone cancer Mother    Coronary artery disease Father        w/ first MI age 13, GF MI in the early 45s   Hypertension Sister    Macular degeneration Maternal Aunt    Prostate cancer Neg Hx    Colon cancer Neg Hx    Diabetes Neg Hx    Social History   Socioeconomic History   Marital status: Divorced    Spouse name: Not on file   Number of children: 1   Years of education: Not on file   Highest education level: Bachelor's degree (e.g., BA, AB, BS)  Occupational History   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, hearing aid  Tobacco Use   Smoking status: Never   Smokeless tobacco: Never   Vaping Use   Vaping status: Never Used  Substance and Sexual Activity   Alcohol use: Yes    Comment: socially   Drug use: No   Sexual activity: Yes  Other Topics Concern   Not on file  Social History Narrative   Original from [place]    Lives w/  girlfriend part time    Lost son ~ 2019 (OD)         Social Drivers of Corporate investment banker Strain: Low Risk  (01/07/2024)   Overall Financial Resource Strain (CARDIA)    Difficulty of Paying Living Expenses: Not hard at all  Food Insecurity: No Food Insecurity (01/07/2024)   Hunger Vital Sign    Worried About Running Out of Food in the Last Year: Never true    Ran Out of Food  in the Last Year: Never true  Transportation Needs: No Transportation Needs (01/07/2024)   PRAPARE - Administrator, Civil Service (Medical): No    Lack of Transportation (Non-Medical): No  Physical Activity: Sufficiently Active (01/07/2024)   Exercise Vital Sign    Days of Exercise per Week: 5 days    Minutes of Exercise per Session: 60 min  Stress: No Stress Concern Present (01/07/2024)   [employer] of Occupational Health - Occupational Stress Questionnaire    Feeling of Stress : Not at all  Social Connections: Moderately Isolated (01/07/2024)   Social Connection and Isolation Panel [NHANES]    Frequency of Communication with Friends and Family: More than three times a week    Frequency of Social Gatherings with Friends and Family: More than three times a week    Attends Religious Services: More than 4 times per year    Active Member of [employer] or Organizations: No    Attends Engineer, structural: Never    Marital Status: Divorced    Tobacco Counseling Counseling given: Yes   Clinical Intake:  Pre-visit preparation completed: Yes  Pain : No/denies pain     BMI - recorded: 28.21 Nutritional Risks: None Diabetes: No  How often do you need to have someone help you when you read instructions, pamphlets, or other  written materials from your doctor or pharmacy?: 1 - Never  Interpreter Needed?: No  Information entered by :: Maryjean Ka CMA   Activities of Daily Living    01/07/2024    8:25 AM  In your present state of health, do you have any difficulty performing the following activities:  Hearing? 0  Vision? 0  Difficulty concentrating or making decisions? 0  Walking or climbing stairs? 0  Dressing or bathing? 0  Doing errands, shopping? 0  Preparing Food and eating ? N  Using the Toilet? N  In the past six months, have you accidently leaked urine? N  Do you have problems with loss of bowel control? N  Managing your Medications? N  Managing your Finances? N  Housekeeping or managing your Housekeeping? N    Patient Care Team: Wanda Plump, MD as PCP - General Graylin Shiver, MD as Consulting Physician (Gastroenterology) Bunnie Pion, OD (Optometry) Deirdre Evener, MD (Dermatology)  Indicate any recent Medical Services you may have received from other than [facility] providers in the past year (date may be approximate).     Assessment:   This is a routine wellness examination for Ferron.  Hearing/Vision screen Hearing Screening - Comments:: Patient denies any hearing difficulties.   Vision Screening - Comments:: Patient does not need glasses. He still 20/20 vision. Sees Dr. Karie Soda in [place] for yearly exams   Goals Addressed             This Visit's Progress    Patient Stated       To be a better golfer       Depression Screen    01/07/2024    8:57 AM 10/09/2023   11:08 AM 02/04/2023    8:59 AM 11/13/2022    8:50 AM 10/22/2021    9:07 AM 01/09/2021    1:43 PM 11/04/2019    1:56 PM  PHQ 2/9 Scores  PHQ - 2 Score 0 0 0 0 0 0 0  PHQ- 9 Score    0       Fall Risk    01/07/2024    8:25 AM 10/09/2023  11:08 AM 02/04/2023    8:59 AM 11/13/2022    8:49 AM 10/22/2021    9:06 AM  Fall Risk   Falls in the past year? 0 0 0 0 0  Number falls in past yr: 0 0 0 0 0  Injury  with Fall? 0 0 0 0 0  Risk for fall due to : No Fall Risks      Follow up Falls prevention discussed Falls evaluation completed Falls evaluation completed Falls evaluation completed Falls prevention discussed    MEDICARE RISK AT HOME: Medicare Risk at Home Any stairs in or around the home?: (Patient-Rptd) Yes If so, are there any without handrails?: (Patient-Rptd) No Home free of loose throw rugs in walkways, pet beds, electrical cords, etc?: (Patient-Rptd) No Adequate lighting in your home to reduce risk of falls?: (Patient-Rptd) Yes Life alert?: (Patient-Rptd) No Use of a cane, walker or w/c?: (Patient-Rptd) No Grab bars in the bathroom?: (Patient-Rptd) No Shower chair or bench in shower?: (Patient-Rptd) No Elevated toilet seat or a handicapped toilet?: (Patient-Rptd) No  TIMED UP AND GO:  Was the test performed?  No    Cognitive Function:        01/07/2024    8:57 AM  6CIT Screen  What Year? 0 points  What month? 0 points  What time? 0 points  Count back from 20 0 points  Months in reverse 0 points  Repeat phrase 0 points  Total Score 0 points    Immunizations Immunization History  Administered Date(s) Administered   Fluad Quad(high Dose 65+) 11/04/2019, 01/09/2021   Influenza,inj,Quad PF,6+ Mos 10/25/2014   Influenza-Unspecified 10/06/2017, 10/24/2018   PFIZER(Purple Top)SARS-COV-2 Vaccination 01/25/2020, 02/15/2020   Pneumococcal Conjugate-13 11/04/2019   Pneumococcal Polysaccharide-23 01/09/2021   Tdap 04/15/2011, 03/07/2013   Zoster Recombinant(Shingrix) 01/18/2022, 04/13/2022   Zoster, Live 03/07/2013    TDAP status: Due, Education has been provided regarding the importance of this vaccine. Advised may receive this vaccine at local pharmacy or Health Dept. Aware to provide a copy of the vaccination record if obtained from local pharmacy or Health Dept. Verbalized acceptance and understanding.  Flu Vaccine status: Declined, Education has been provided  regarding the importance of this vaccine but patient still declined. Advised may receive this vaccine at local pharmacy or Health Dept. Aware to provide a copy of the vaccination record if obtained from local pharmacy or Health Dept. Verbalized acceptance and understanding.  Pneumococcal vaccine status: Up to date  Covid-19 vaccine status: Declined, Education has been provided regarding the importance of this vaccine but patient still declined. Advised may receive this vaccine at local pharmacy or Health Dept.or vaccine clinic. Aware to provide a copy of the vaccination record if obtained from local pharmacy or Health Dept. Verbalized acceptance and understanding.  Qualifies for Shingles Vaccine? No   Zostavax completed No   Shingrix Completed?: Yes  Screening Tests Health Maintenance  Topic Date Due   COVID-19 Vaccine (3 - Pfizer risk series) 03/14/2020   DTaP/Tdap/Td (3 - Td or Tdap) 03/08/2023   Medicare Annual Wellness (AWV)  11/14/2023   INFLUENZA VACCINE  03/14/2024 (Originally 07/16/2023)   Colonoscopy  05/21/2028   Pneumonia Vaccine 28+ Years old  Completed   Hepatitis C Screening  Completed   Zoster Vaccines- Shingrix  Completed   HPV VACCINES  Aged Out    Health Maintenance  Health Maintenance Due  Topic Date Due   COVID-19 Vaccine (3 - Pfizer risk series) 03/14/2020   DTaP/Tdap/Td (3 - Td or Tdap)  03/08/2023   Medicare Annual Wellness (AWV)  11/14/2023    Colorectal cancer screening: Type of screening: Colonoscopy. Completed 05/21/2018. Repeat every 10 years  Lung Cancer Screening: (Low Dose CT Chest recommended if Age 81-80 years, 20 pack-year currently smoking OR have quit w/in 15years.) does not qualify.   Lung Cancer Screening Referral: na  Additional Screening:  Hepatitis C Screening: does not qualify; Completed   Vision Screening: Recommended annual ophthalmology exams for early detection of glaucoma and other disorders of the eye. Is the patient up to date  with their annual eye exam?  Yes  Who is the provider or what is the name of the office in which the patient attends annual eye exams? Dr. Karie Soda in [place]  If pt is not established with a provider, would they like to be referred to a provider to establish care? No .   Dental Screening: Recommended annual dental exams for proper oral hygiene  Diabetic Foot Exam: na  Community Resource Referral / Chronic Care Management: CRR required this visit?  No   CCM required this visit?  No     Plan:     I have personally reviewed and noted the following in the patient's chart:   Medical and social history Use of alcohol, tobacco or illicit drugs  Current medications and supplements including opioid prescriptions. Patient is not currently taking opioid prescriptions. Functional ability and status Nutritional status Physical activity Advanced directives List of other physicians Hospitalizations, surgeries, and ER visits in previous 12 months Vitals Screenings to include cognitive, depression, and falls Referrals and appointments  In addition, I have reviewed and discussed with patient certain preventive protocols, quality metrics, and best practice recommendations. A written personalized care plan for preventive services as well as general preventive health recommendations were provided to patient.     Jordan Hawks Chayim Bialas, CMA   01/07/2024   After Visit Summary: (MyChart) Due to this being a telephonic visit, the after visit summary with patients personalized plan was offered to patient via MyChart

## 2024-01-07 NOTE — Patient Instructions (Signed)
Mr. Hauptmann , Thank you for taking time to come for your Medicare Wellness Visit. I appreciate your ongoing commitment to your health goals. Please review the following plan we discussed and let me know if I can assist you in the future.   Referrals/Orders/Follow-Ups/Clinician Recommendations:  Next Medicare Annual Wellness Visit:we will contact you to schedule you appointment for your Medicare AWV.      This is a list of the screening recommended for you and due dates:  Health Maintenance  Topic Date Due   COVID-19 Vaccine (3 - Pfizer risk series) 03/14/2020   DTaP/Tdap/Td vaccine (3 - Td or Tdap) 03/08/2023   Flu Shot  03/14/2024*   Medicare Annual Wellness Visit  01/06/2025   Colon Cancer Screening  05/21/2028   Pneumonia Vaccine  Completed   Hepatitis C Screening  Completed   Zoster (Shingles) Vaccine  Completed   HPV Vaccine  Aged Out  *Topic was postponed. The date shown is not the original due date.    Advanced directives: (Declined) Advance directive discussed with you today. Even though you declined this today, please call our office should you change your mind, and we can give you the proper paperwork for you to fill out.  Next Medicare Annual Wellness Visit scheduled for next year: yes  Preventive Care 13 Years and Older, Male Preventive care refers to lifestyle choices and visits with your health care provider that can promote health and wellness. Preventive care visits are also called wellness exams. What can I expect for my preventive care visit? Counseling During your preventive care visit, your health care provider may ask about your: Medical history, including: Past medical problems. Family medical history. History of falls. Current health, including: Emotional well-being. Home life and relationship well-being. Sexual activity. Memory and ability to understand (cognition). Lifestyle, including: Alcohol, nicotine or tobacco, and drug use. Access to  firearms. Diet, exercise, and sleep habits. Work and work Astronomer. Sunscreen use. Safety issues such as seatbelt and bike helmet use. Physical exam Your health care provider will check your: Height and weight. These may be used to calculate your BMI (body mass index). BMI is a measurement that tells if you are at a healthy weight. Waist circumference. This measures the distance around your waistline. This measurement also tells if you are at a healthy weight and may help predict your risk of certain diseases, such as type 2 diabetes and high blood pressure. Heart rate and blood pressure. Body temperature. Skin for abnormal spots. What immunizations do I need?  Vaccines are usually given at various ages, according to a schedule. Your health care provider will recommend vaccines for you based on your age, medical history, and lifestyle or other factors, such as travel or where you work. What tests do I need? Screening Your health care provider may recommend screening tests for certain conditions. This may include: Lipid and cholesterol levels. Diabetes screening. This is done by checking your blood sugar (glucose) after you have not eaten for a while (fasting). Hepatitis C test. Hepatitis B test. HIV (human immunodeficiency virus) test. STI (sexually transmitted infection) testing, if you are at risk. Lung cancer screening. Colorectal cancer screening. Prostate cancer screening. Abdominal aortic aneurysm (AAA) screening. You may need this if you are a current or former smoker. Talk with your health care provider about your test results, treatment options, and if necessary, the need for more tests. Follow these instructions at home: Eating and drinking  Eat a diet that includes fresh fruits and vegetables,  whole grains, lean protein, and low-fat dairy products. Limit your intake of foods with high amounts of sugar, saturated fats, and salt. Take vitamin and mineral supplements as  recommended by your health care provider. Do not drink alcohol if your health care provider tells you not to drink. If you drink alcohol: Limit how much you have to 0-2 drinks a day. Know how much alcohol is in your drink. In the [place] one drink equals one 12 oz bottle of beer (355 mL), one 5 oz glass of wine (148 mL), or one 1 oz glass of hard liquor (44 mL). Lifestyle Brush your teeth every morning and night with fluoride toothpaste. Floss one time each day. Exercise for at least 30 minutes 5 or more days each week. Do not use any products that contain nicotine or tobacco. These products include cigarettes, chewing tobacco, and vaping devices, such as e-cigarettes. If you need help quitting, ask your health care provider. Do not use drugs. If you are sexually active, practice safe sex. Use a condom or other form of protection to prevent STIs. Take aspirin only as told by your health care provider. Make sure that you understand how much to take and what form to take. Work with your health care provider to find out whether it is safe and beneficial for you to take aspirin daily. Ask your health care provider if you need to take a cholesterol-lowering medicine (statin). Find healthy ways to manage stress, such as: Meditation, yoga, or listening to music. Journaling. Talking to a trusted person. Spending time with friends and family. Safety Always wear your seat belt while driving or riding in a vehicle. Do not drive: If you have been drinking alcohol. Do not ride with someone who has been drinking. When you are tired or distracted. While texting. If you have been using any mind-altering substances or drugs. Wear a helmet and other protective equipment during sports activities. If you have firearms in your house, make sure you follow all gun safety procedures. Minimize exposure to UV radiation to reduce your risk of skin cancer. What's next? Visit your health care provider once a year for  an annual wellness visit. Ask your health care provider how often you should have your eyes and teeth checked. Stay up to date on all vaccines. This information is not intended to replace advice given to you by your health care provider. Make sure you discuss any questions you have with your health care provider. Document Revised: 05/29/2021 Document Reviewed: 05/29/2021 Elsevier Patient Education  2024 [employer].  Understanding Your Risk for Falls Millions of people have serious injuries from falls each year. It is important to understand your risk of falling. Talk with your health care provider about your risk and what you can do to lower it. If you do have a serious fall, make sure to tell your provider. Falling once raises your risk of falling again. How can falls affect me? Serious injuries from falls are common. These include: Broken bones, such as hip fractures. Head injuries, such as traumatic brain injuries (TBI) or concussions. A fear of falling can cause you to avoid activities and stay at home. This can make your muscles weaker and raise your risk for a fall. What can increase my risk? There are a number of risk factors that increase your risk for falling. The more risk factors you have, the higher your risk of falling. Serious injuries from a fall happen most often to people who are older than  72 years old. Teenagers and young adults ages 56-29 are also at higher risk. Common risk factors include: Weakness in the lower body. Being generally weak or confused due to long-term (chronic) illness. Dizziness or balance problems. Poor vision. Medicines that cause dizziness or drowsiness. These may include: Medicines for your blood pressure, heart, anxiety, insomnia, or swelling (edema). Pain medicines. Muscle relaxants. Other risk factors include: Drinking alcohol. Having had a fall in the past. Having foot pain or wearing improper footwear. Working at a dangerous job. Having  any of the following in your home: Tripping hazards, such as floor clutter or loose rugs. Poor lighting. Pets. Having dementia or memory loss. What actions can I take to lower my risk of falling?     Physical activity Stay physically fit. Do strength and balance exercises. Consider taking a regular class to build strength and balance. Yoga and tai chi are good options. Vision Have your eyes checked every year and your prescription for glasses or contacts updated as needed. Shoes and walking aids Wear non-skid shoes. Wear shoes that have rubber soles and low heels. Do not wear high heels. Do not walk around the house in socks or slippers. Use a cane or walker as told by your provider. Home safety Attach secure railings on both sides of your stairs. Install grab bars for your bathtub, shower, and toilet. Use a non-skid mat in your bathtub or shower. Attach bath mats securely with double-sided, non-slip rug tape. Use good lighting in all rooms. Keep a flashlight near your bed. Make sure there is a clear path from your bed to the bathroom. Use night-lights. Do not use throw rugs. Make sure all carpeting is taped or tacked down securely. Remove all clutter from walkways and stairways, including extension cords. Repair uneven or broken steps and floors. Avoid walking on icy or slippery surfaces. Walk on the grass instead of on icy or slick sidewalks. Use ice melter to get rid of ice on walkways in the winter. Use a cordless phone. Questions to ask your health care provider Can you help me check my risk for a fall? Do any of my medicines make me more likely to fall? Should I take a vitamin D supplement? What exercises can I do to improve my strength and balance? Should I make an appointment to have my vision checked? Do I need a bone density test to check for weak bones (osteoporosis)? Would it help to use a cane or a walker? Where to find more information Centers for Disease Control  and Prevention, STEADI: TonerPromos.no Community-Based Fall Prevention Programs: TonerPromos.no [employer] on Aging: BaseRingTones.pl Contact a health care provider if: You fall at home. You are afraid of falling at home. You feel weak, drowsy, or dizzy. This information is not intended to replace advice given to you by your health care provider. Make sure you discuss any questions you have with your health care provider. Document Revised: 08/04/2022 Document Reviewed: 08/04/2022 Elsevier Patient Education  2024 [employer].

## 2024-01-19 ENCOUNTER — Telehealth: Payer: Self-pay

## 2024-01-19 DIAGNOSIS — R21 Rash and other nonspecific skin eruption: Secondary | ICD-10-CM

## 2024-01-19 MED ORDER — MOMETASONE FUROATE 0.1 % EX CREA: 1.0000 | TOPICAL_CREAM | CUTANEOUS | 0 refills | Status: DC

## 2024-01-19 NOTE — Telephone Encounter (Signed)
Patient calling requesting a refill of Mometasone cream, patient treated his hands and arms with 5FU/Calcipotriene cream he would like Mometasone cream Dr Kirtland Bouchard has given him in the past to calm the irritation down on his arms and hands.  [facility]

## 2024-01-19 NOTE — Addendum Note (Signed)
Addended by: Elie Goody on: 01/19/2024 05:58 PM   Modules accepted: Orders

## 2024-01-19 NOTE — Telephone Encounter (Signed)
[facility]

## 2024-01-20 ENCOUNTER — Other Ambulatory Visit: Payer: Self-pay

## 2024-01-30 ENCOUNTER — Encounter: Payer: Self-pay | Admitting: Internal Medicine

## 2024-01-30 DIAGNOSIS — E291 Testicular hypofunction: Secondary | ICD-10-CM

## 2024-01-30 DIAGNOSIS — Z0000 Encounter for general adult medical examination without abnormal findings: Secondary | ICD-10-CM

## 2024-01-30 DIAGNOSIS — E785 Hyperlipidemia, unspecified: Secondary | ICD-10-CM

## 2024-01-30 DIAGNOSIS — I10 Essential (primary) hypertension: Secondary | ICD-10-CM

## 2024-02-01 NOTE — Addendum Note (Signed)
Addended byConrad [place] D on: 02/01/2024 10:09 AM   Modules accepted: Orders

## 2024-02-01 NOTE — Telephone Encounter (Signed)
 Orders placed.

## 2024-02-01 NOTE — Telephone Encounter (Signed)
BMP AST ALT FLP CBC total testosterone PSA We can use the following diagnosis:  Low testosterone HTN Hyperlipidemia CPX

## 2024-02-02 ENCOUNTER — Other Ambulatory Visit (INDEPENDENT_AMBULATORY_CARE_PROVIDER_SITE_OTHER): Payer: Medicare Other

## 2024-02-02 DIAGNOSIS — E291 Testicular hypofunction: Secondary | ICD-10-CM | POA: Diagnosis not present

## 2024-02-02 DIAGNOSIS — E785 Hyperlipidemia, unspecified: Secondary | ICD-10-CM

## 2024-02-02 DIAGNOSIS — Z0000 Encounter for general adult medical examination without abnormal findings: Secondary | ICD-10-CM

## 2024-02-02 DIAGNOSIS — I10 Essential (primary) hypertension: Secondary | ICD-10-CM

## 2024-02-02 LAB — CBC WITH DIFFERENTIAL/PLATELET
Basophils Absolute: 0 10*3/uL (ref 0.0–0.1)
Basophils Relative: 0.4 % (ref 0.0–3.0)
Eosinophils Absolute: 0.5 10*3/uL (ref 0.0–0.7)
Eosinophils Relative: 7.8 % — ABNORMAL HIGH (ref 0.0–5.0)
HCT: 42.2 % (ref 39.0–52.0)
Hemoglobin: 14.5 g/dL (ref 13.0–17.0)
Lymphocytes Relative: 36.8 % (ref 12.0–46.0)
Lymphs Abs: 2.5 10*3/uL (ref 0.7–4.0)
MCHC: 34.4 g/dL (ref 30.0–36.0)
MCV: 91.9 fL (ref 78.0–100.0)
Monocytes Absolute: 0.6 10*3/uL (ref 0.1–1.0)
Monocytes Relative: 8.8 % (ref 3.0–12.0)
Neutro Abs: 3.1 10*3/uL (ref 1.4–7.7)
Neutrophils Relative %: 46.2 % (ref 43.0–77.0)
Platelets: 210 10*3/uL (ref 150.0–400.0)
RBC: 4.59 Mil/uL (ref 4.22–5.81)
RDW: 12.9 % (ref 11.5–15.5)
WBC: 6.7 10*3/uL (ref 4.0–10.5)

## 2024-02-02 LAB — BASIC METABOLIC PANEL
BUN: 16 mg/dL (ref 6–23)
CO2: 29 meq/L (ref 19–32)
Calcium: 9.4 mg/dL (ref 8.4–10.5)
Chloride: 103 meq/L (ref 96–112)
Creatinine, Ser: 0.92 mg/dL (ref 0.40–1.50)
GFR: 83.55 mL/min (ref >60.00)
Glucose, Bld: 100 mg/dL — ABNORMAL HIGH (ref 70–99)
Potassium: 4.2 meq/L (ref 3.5–5.1)
Sodium: 139 meq/L (ref 135–145)

## 2024-02-02 LAB — LIPID PANEL
Cholesterol: 139 mg/dL (ref 0–200)
HDL: 59.3 mg/dL (ref >39.00)
LDL Cholesterol: 64 mg/dL (ref 0–99)
NonHDL: 79.24
Total CHOL/HDL Ratio: 2
Triglycerides: 75 mg/dL (ref 0.0–149.0)
VLDL: 15 mg/dL (ref 0.0–40.0)

## 2024-02-02 LAB — ALT: ALT: 17 U/L (ref 0–53)

## 2024-02-02 LAB — TESTOSTERONE: Testosterone: 323.76 ng/dL (ref 300.00–890.00)

## 2024-02-02 LAB — PSA: PSA: 0.82 ng/mL (ref 0.10–4.00)

## 2024-02-02 LAB — AST: AST: 20 U/L (ref 0–37)

## 2024-02-02 NOTE — Progress Notes (Unsigned)
 Marland Kitchen

## 2024-02-09 ENCOUNTER — Ambulatory Visit (INDEPENDENT_AMBULATORY_CARE_PROVIDER_SITE_OTHER): Payer: Medicare Other | Admitting: Internal Medicine

## 2024-02-09 ENCOUNTER — Encounter: Payer: Self-pay | Admitting: Internal Medicine

## 2024-02-09 VITALS — BP 134/86 | HR 63 | Temp 97.5°F | Ht 69.0 in | Wt 194.6 lb

## 2024-02-09 DIAGNOSIS — Z0000 Encounter for general adult medical examination without abnormal findings: Secondary | ICD-10-CM | POA: Diagnosis not present

## 2024-02-09 NOTE — Progress Notes (Signed)
 Subjective:    Patient ID: Ralph [name], male    DOB: 12-25-1950, 73 y.o.   MRN: [id number]  DOS:  02/09/2024 Type of visit - description: CPX  Here for CPX. Feeling really well.   Review of Systems   A 14 point review of systems is negative    Past Medical History:  Diagnosis Date   Basal cell carcinoma 05/07/2015   R mid back    Basal cell carcinoma 04/17/2022   L shoulder, EDC by Trula Ore Haverstock PA   Basal cell carcinoma 04/17/2022   Mid back, EDC by Christina Haverstock PA Brenas   Basal cell carcinoma 10/09/2022   Left inf med scapula - EDC   Basal cell carcinoma 10/09/2022   Left prox med calf - EDC   Basal cell carcinoma 10/15/2023   R nose, MOHs completed 11/05/23   Hyperlipidemia 2007   Hypertension 2007   Inguinal hernia bilateral, non-recurrent    Normal cardiac stress test 2008   per pt   Squamous cell carcinoma of skin 10/09/2022   Right pectoral (in situ) - EDC    Past Surgical History:  Procedure Laterality Date   INGUINAL HERNIA REPAIR Bilateral 05/31/2018   Procedure: LAPAROSCOPIC BILATERAL INGUINAL HERNIA REPAIR ERAS PATHWAY;  Surgeon: Abigail Miyamoto, MD;  Location: [facility];  Service: General;  Laterality: Bilateral;   INGUINAL HERNIA REPAIR Left 10/07/2018   Procedure: OPEN LEFT INGUINAL HERNIA REPAIR ERAS PATHWAY;  Surgeon: Abigail Miyamoto, MD;  Location: [facility] OR;  Service: General;  Laterality: Left;  TAP BLOCK   INSERTION OF MESH Bilateral 05/31/2018   Procedure: INSERTION OF MESH;  Surgeon: Abigail Miyamoto, MD;  Location: [facility];  Service: General;  Laterality: Bilateral;   INSERTION OF MESH Left 10/07/2018   Procedure: INSERTION OF MESH;  Surgeon: Abigail Miyamoto, MD;  [facility];  Service: General;  Laterality: Left;   MOHS SURGERY Right 10/2023   nose   Social History   Socioeconomic History   Marital status: Divorced    Spouse name: Not on file   Number of children: 1    Years of education: Not on file   Highest education level: Bachelor's degree (e.g., BA, AB, BS)  Occupational History   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, hearing aid  Tobacco Use   Smoking status: Never   Smokeless tobacco: Never  Vaping Use   Vaping status: Never Used  Substance and Sexual Activity   Alcohol use: Yes    Comment: socially   Drug use: No   Sexual activity: Yes  Other Topics Concern   Not on file  Social History Narrative   Original from [place]    Lives w/  girlfriend part time    Lost son ~ 2019 (OD)         Social Drivers of Corporate investment banker Strain: Low Risk  (01/07/2024)   Overall Financial Resource Strain (CARDIA)    Difficulty of Paying Living Expenses: Not hard at all  Food Insecurity: No Food Insecurity (01/07/2024)   Hunger Vital Sign    Worried About Running Out of Food in the Last Year: Never true    Ran Out of Food in the Last Year: Never true  Transportation Needs: No Transportation Needs (01/07/2024)   PRAPARE - Administrator, Civil Service (Medical): No    Lack of Transportation (Non-Medical): No  Physical Activity: Sufficiently Active (01/07/2024)   Exercise Vital Sign    Days of Exercise per  Week: 5 days    Minutes of Exercise per Session: 60 min  Stress: No Stress Concern Present (01/07/2024)   [employer] of Occupational Health - Occupational Stress Questionnaire    Feeling of Stress : Not at all  Social Connections: Moderately Isolated (01/07/2024)   Social Connection and Isolation Panel [NHANES]    Frequency of Communication with Friends and Family: More than three times a week    Frequency of Social Gatherings with Friends and Family: More than three times a week    Attends Religious Services: More than 4 times per year    Active Member of [employer] or Organizations: No    Attends Banker Meetings: Never    Marital Status: Divorced  Catering manager Violence: Not At Risk (01/07/2024)   Humiliation,  Afraid, Rape, and Kick questionnaire    Fear of Current or Ex-Partner: No    Emotionally Abused: No    Physically Abused: No    Sexually Abused: No     Current Outpatient Medications  Medication Instructions   aspirin EC 81 mg, Oral, Daily, Swallow whole.   atorvastatin (LIPITOR) 40 mg, Oral, Daily   doxycycline (VIBRAMYCIN) 50 MG capsule Take one po QD for pimples. Take with food and plenty of drink.   ezetimibe (ZETIA) 10 mg, Oral, Daily   fluticasone (FLONASE) 50 MCG/ACT nasal spray 2 sprays, Each Nare, Daily PRN   Ivermectin 1 % CREA 1 Application, Apply externally, Daily at bedtime, Qhs to scalp   ketoconazole (NIZORAL) 2 % shampoo 1 Application, Topical, 2 times weekly, Wash scalp 2 times weekly, let sit 5 minutes and rinse out   losartan (COZAAR) 50 mg, Daily   mometasone (ELOCON) 0.1 % cream 1 Application, Topical, As directed, Apply thin layer to irritated areas twice daily until irritation is more comfortable then stop       Objective:   Physical Exam BP 134/86   Pulse 63   Temp (!) 97.5 F (36.4 C)   Ht 5\' 9"  (1.753 m)   Wt 194 lb 9.6 oz (88.3 kg)   SpO2 97%   BMI 28.74 kg/m  General: Well developed, NAD, BMI noted Neck: No  thyromegaly  HEENT:  Normocephalic . Face symmetric, atraumatic Lungs:  CTA B Normal respiratory effort, no intercostal retractions, no accessory muscle use. Heart: RRR,  no murmur.  Abdomen:  Not distended, soft, non-tender. No rebound or rigidity.   Lower extremities: no pretibial edema bilaterally  Skin: Exposed areas without rash. Not pale. Not jaundice Neurologic:  alert & oriented X3.  Speech normal, gait appropriate for age and unassisted Strength symmetric and appropriate for age.  Psych: Cognition and judgment appear intact.  Cooperative with normal attention span and concentration.  Behavior appropriate. No anxious or depressed appearing.     Assessment    Assessment HTN Hyperlipidemia Tinnitus (used to take xanas  very rarely) E.D. BCC, rosacea - derm q year + FH CAD, coronary calcium score February 2023: 94.3 H/o Posterior vitrious detachment R    PLAN Here for CPX - Td 2014 --PNM 13: 2020.  PNM 23: 2022 - s/p zostavax and -shingrix x 2 -COVID vax booster rec - declines  - vaccines I rec: Flu shot every fall, COVID booster, Tdap at the pharmacy --Prostate ca screening: No symptoms, last PSA normal    --Cscope 01-2006, saw [facility] 04/2018, Cscope June 06/2018, benign bx, 10 years per GI letter to the patient --Diet-exercise: Doing great, eats healthy, exercises regularly -Labs completed recently,  normal and reviewed -Healthcare POA: Information provided. Other issues: HTN: Ambulatory BPs 123/69, 111/62, 123/70.  Last BMP okay, continue losartan. Hyperlipidemia: On Lipitor, Zetia, last LDL less than 70.  No change. RTC 6 months

## 2024-02-09 NOTE — Assessment & Plan Note (Signed)
 Here for CPX - Td 2014 --PNM 13: 2020.  PNM 23: 2022 - s/p zostavax and -shingrix x 2 -COVID vax booster rec - declines  - vaccines I rec: Flu shot every fall, COVID booster, Tdap at the pharmacy --Prostate ca screening: No symptoms, last PSA normal    --Cscope 01-2006, saw [facility] 04/2018, Cscope June 06/2018, benign bx, 10 years per GI letter to the patient --Diet-exercise: Doing great, eats healthy, exercises regularly -Labs completed recently, normal and reviewed -Healthcare POA: Information provided.

## 2024-02-09 NOTE — Assessment & Plan Note (Signed)
 Here for CPX  Other issues: HTN: Ambulatory BPs 123/69, 111/62, 123/70.  Last BMP okay, continue losartan. Hyperlipidemia: On Lipitor, Zetia, last LDL less than 70.  No change. RTC 6 months

## 2024-02-09 NOTE — Patient Instructions (Signed)
 Vaccines: Tdap (tetanus) @ the pharmacy Flu shot every fall A Covid Booster  Check the  blood pressure regularly Blood pressure goal:  between 110/65 and  135/85. If it is consistently higher or lower, let me know       Next visit with me in 6 months      Please schedule it at the front desk       "Health Care Power of attorney" ,  "Living will" (Advance care planning documents)  If you already have a living will or healthcare power of attorney, is recommended you bring the copy to be scanned in your chart.   The document will be available to all the doctors you see in the system.  Advance care planning is a process that supports adults in  understanding and sharing their preferences regarding future medical care.  The patient's preferences are recorded in documents called Advance Directives and the can be modified at any time while the patient is in full mental capacity.   If you don't have one, please consider create one.      More information at: StageSync.si

## 2024-03-21 ENCOUNTER — Telehealth: Payer: Self-pay

## 2024-03-21 NOTE — Telephone Encounter (Signed)
 Patient called requesting a refill of [facility] Rosacea triple cream. Refill sent to [facility]

## 2024-04-04 ENCOUNTER — Encounter: Payer: Self-pay | Admitting: Internal Medicine

## 2024-04-07 ENCOUNTER — Other Ambulatory Visit: Payer: Self-pay | Admitting: Dermatology

## 2024-04-07 DIAGNOSIS — L719 Rosacea, unspecified: Secondary | ICD-10-CM

## 2024-04-12 ENCOUNTER — Other Ambulatory Visit: Payer: Self-pay | Admitting: Internal Medicine

## 2024-05-04 ENCOUNTER — Other Ambulatory Visit: Payer: Self-pay | Admitting: Internal Medicine

## 2024-05-04 MED ORDER — FLUTICASONE PROPIONATE 50 MCG/ACT NA SUSP: 2.0000 | Freq: Every day | NASAL | 1 refills | Status: AC | PRN

## 2024-05-04 NOTE — Telephone Encounter (Signed)
 Copied from CRM [id number]. Topic: Clinical - Medication Refill >> May 04, 2024  9:23 AM Howard Macho wrote: Medication: fluticasone  (FLONASE ) 50 MCG/ACT nasal spray  Has the patient contacted their pharmacy? No (Agent: If no, request that the patient contact the pharmacy for the refill. If patient does not wish to contact the pharmacy document the reason why and proceed with request.) (Agent: If yes, when and what did the pharmacy advise?)  This is the patient's preferred pharmacy:  [id number], [place] - [address] [place] [address] Phone: [phone] Fax: [phone]  Is this the correct pharmacy for this prescription? Yes If no, delete pharmacy and type the correct one.   Has the prescription been filled recently? No  Is the patient out of the medication? Yes  Has the patient been seen for an appointment in the last year OR does the patient have an upcoming appointment? Yes  Can we respond through MyChart? Yes  Agent: Please be advised that Rx refills may take up to 3 business days. We ask that you follow-up with your pharmacy.

## 2024-07-26 ENCOUNTER — Encounter: Payer: Self-pay | Admitting: Internal Medicine

## 2024-07-26 DIAGNOSIS — I10 Essential (primary) hypertension: Secondary | ICD-10-CM

## 2024-08-03 ENCOUNTER — Other Ambulatory Visit (INDEPENDENT_AMBULATORY_CARE_PROVIDER_SITE_OTHER)

## 2024-08-03 DIAGNOSIS — I10 Essential (primary) hypertension: Secondary | ICD-10-CM

## 2024-08-03 LAB — BASIC METABOLIC PANEL WITH GFR
BUN: 18 mg/dL (ref 6–23)
CO2: 29 meq/L (ref 19–32)
Calcium: 9 mg/dL (ref 8.4–10.5)
Chloride: 104 meq/L (ref 96–112)
Creatinine, Ser: 0.87 mg/dL (ref 0.40–1.50)
GFR: 86.36 mL/min (ref >60.00)
Glucose, Bld: 99 mg/dL (ref 70–99)
Potassium: 4.6 meq/L (ref 3.5–5.1)
Sodium: 139 meq/L (ref 135–145)

## 2024-08-04 ENCOUNTER — Ambulatory Visit: Payer: Self-pay | Admitting: Internal Medicine

## 2024-08-08 ENCOUNTER — Ambulatory Visit: Payer: Medicare Other | Admitting: Internal Medicine

## 2024-08-10 ENCOUNTER — Encounter: Payer: Self-pay | Admitting: Dermatology

## 2024-08-18 DIAGNOSIS — H25813 Combined forms of age-related cataract, bilateral: Secondary | ICD-10-CM | POA: Diagnosis not present

## 2024-08-31 ENCOUNTER — Ambulatory Visit (INDEPENDENT_AMBULATORY_CARE_PROVIDER_SITE_OTHER)

## 2024-08-31 DIAGNOSIS — Z1283 Encounter for screening for malignant neoplasm of skin: Secondary | ICD-10-CM | POA: Diagnosis not present

## 2024-08-31 DIAGNOSIS — W908XXA Exposure to other nonionizing radiation, initial encounter: Secondary | ICD-10-CM

## 2024-08-31 DIAGNOSIS — D1801 Hemangioma of skin and subcutaneous tissue: Secondary | ICD-10-CM | POA: Diagnosis not present

## 2024-08-31 DIAGNOSIS — D229 Melanocytic nevi, unspecified: Secondary | ICD-10-CM

## 2024-08-31 DIAGNOSIS — Z85828 Personal history of other malignant neoplasm of skin: Secondary | ICD-10-CM | POA: Diagnosis not present

## 2024-08-31 DIAGNOSIS — L578 Other skin changes due to chronic exposure to nonionizing radiation: Secondary | ICD-10-CM | POA: Diagnosis not present

## 2024-08-31 DIAGNOSIS — L814 Other melanin hyperpigmentation: Secondary | ICD-10-CM

## 2024-08-31 DIAGNOSIS — L821 Other seborrheic keratosis: Secondary | ICD-10-CM

## 2024-08-31 DIAGNOSIS — C4490 Unspecified malignant neoplasm of skin, unspecified: Secondary | ICD-10-CM

## 2024-08-31 DIAGNOSIS — L570 Actinic keratosis: Secondary | ICD-10-CM | POA: Diagnosis not present

## 2024-08-31 NOTE — Progress Notes (Signed)
 Subjective   Ralph [name] is a 73 y.o. male who presents for the following: Lesion(s) of concern . Patient is established patient   Today patient reports: Spots at scalp and face, patient has hx of AK's, BCC.   Review of Systems:    No other skin or systemic complaints except as noted in HPI or Assessment and Plan.  The following portions of the chart were reviewed this encounter and updated as appropriate: medications, allergies, medical history  Relevant Medical History:  Personal history of non melanoma skin cancer - see medical history for full details   Objective  Well appearing patient in no apparent distress; mood and affect are within normal limits. Examination was performed of the: Sun Exposed Exam: Scalp, head, eyes, ears, nose, lips, neck, upper extremities, hands, fingers, fingernails  Examination notable for: Angioma(s): Scattered red vascular papule(s)  , Lentigo/lentigines: Scattered pigmented macules that are tan to brown in color and are somewhat non-uniform in shape and concentrated in the sun-exposed areas, Seborrheic Keratosis(es): Stuck-on appearing keratotic papule(s) on the trunk, some  irritated with redness, crusting, edema, and/or partial avulsion, Actinic Damage/Elastosis: chronic sun damage: dyspigmentation, telangiectasia, and wrinkling, Actinic keratosis: Scaly erythematous macule(s) concentrated on sun exposed areas   Examination limited by: clothing, deferred removal    face, scalp, R hand (18) Erythematous thin papules/macules with gritty scale.   Assessment & Plan    SKIN CANCER SCREENING PERFORMED TODAY - sun exposed exam today   BENIGN SKIN FINDINGS  - Lentigines  - Seborrheic keratoses  - Hemangiomas   - Nevus/Multiple Benign Nevi - Reassurance provided regarding the benign appearance of lesions noted on exam today; no treatment is indicated in the absence of symptoms/changes. - Reinforced importance of photoprotective strategies including  liberal and frequent sunscreen use of a broad-spectrum SPF 30 or greater, use of protective clothing, and sun avoidance for prevention of cutaneous malignancy and photoaging.  Counseled patient on the importance of regular self-skin monitoring as well as routine clinical skin examinations as scheduled.   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  - Discussed very small risk of malignant transformation over many years - Recommended field treatment with fluorouracil cream twice daily for 3-4 weeks until clinical endpoint of erythema and scaling is reached - Discussed recommendation for treatment of visible AKs with cryotherapy, which patient consents to today: procedure below  - - Start 5-fluorouracil/calcipotriene cream twice a day for 3-5  days to affected areas including face and scalp. [facility] Patient advised they will receive an email or phone call to purchase the medication online and have it sent to their home. Patient provided with handout reviewing treatment course and side effects and advised to call or message us  on MyChart with any concerns. - Reviewed course of treatment and expected reaction.  Patient advised to expect inflammation and crusting and advised that erosions are possible.  Patient advised to be diligent with sun protection during and after treatment. Counseled to keep medication out of reach of children and pets.  Personal history of non melanoma skin cancer  - Reviewed medical history for full details  - Reviewed sun protective measures as above - Encouraged full body skin exams    Procedures, orders, diagnosis for this visit:  AK (ACTINIC KERATOSIS) (18) face, scalp, R hand (18) Actinic keratoses are precancerous spots that appear secondary to cumulative  UV radiation exposure/sun exposure over time.  They are chronic with expected duration over 1 year. A portion of actinic keratoses will progress to squamous cell carcinoma of the skin. It is not possible to reliably predict which spots will progress to skin cancer and so treatment is recommended to prevent development of skin cancer.  Recommend daily broad spectrum sunscreen SPF 30+ to sun-exposed areas, reapply every 2 hours as needed.  Recommend staying in the shade or wearing long sleeves, sun glasses (UVA+UVB protection) and wide brim hats (4-inch brim around the entire circumference of the hat). Call for new or changing lesions.  Patient prefers LN2 > 5FU/calcipotriene  Destruction of lesion - face, scalp, R hand (18) Complexity: simple   Destruction method: cryotherapy   Informed consent: discussed and consent obtained   Timeout:  patient name, date of birth, surgical site, and procedure verified Lesion destroyed using liquid nitrogen: Yes   Region frozen until ice ball extended beyond lesion: Yes   Cryo cycles: 1 or 2. Outcome: patient tolerated procedure well with no complications   Post-procedure details: wound care instructions given     AK (actinic keratosis) -     Destruction of lesion    Return to clinic: Return for TBSE, as scheduled, with Dr. MARLA, HxAK, HxBCC, HxSCC.  Documentation: I have reviewed the above documentation for accuracy and completeness, and I agree with the above.  Lauraine JAYSON Kanaris, MD

## 2024-08-31 NOTE — Patient Instructions (Signed)

## 2024-09-19 ENCOUNTER — Encounter: Payer: Self-pay | Admitting: Internal Medicine

## 2024-09-19 ENCOUNTER — Ambulatory Visit (INDEPENDENT_AMBULATORY_CARE_PROVIDER_SITE_OTHER): Admitting: Internal Medicine

## 2024-09-19 VITALS — BP 134/80 | HR 71 | Temp 97.8°F | Resp 16 | Ht 69.0 in | Wt 187.0 lb

## 2024-09-19 DIAGNOSIS — E785 Hyperlipidemia, unspecified: Secondary | ICD-10-CM | POA: Diagnosis not present

## 2024-09-19 DIAGNOSIS — I10 Essential (primary) hypertension: Secondary | ICD-10-CM

## 2024-09-19 NOTE — Assessment & Plan Note (Signed)
 Hyperlipidemia Hyperlipidemia is well-controlled with current medication regimen. Last LDL was 64, which is within target range. - Continue Atorvastatin  (Lipitor) 40 mg orally daily and Zetia   HTN: Stopped losartan  months ago due to low BPs, blood pressure remains well-controlled w/ recent readings as low as 100/60.  When BP is low he used a compression stocking and make the blood pressure go up to the 120s.  He has no symptoms.    Plan: Stay off losartan , restart if appropriate, continue monitoring BPs. Aspirin : Self stopped it, recommend to restart, benefits discussed.  He has s/e or  bleeding General Health Maintenance He declined flu and COVID-19 vaccinations despite being advised of the benefits and low risks.  . RTC 01-2025 CPX

## 2024-09-19 NOTE — Progress Notes (Signed)
 Subjective:    Patient ID: Ralph [name], male    DOB: 03/05/1951, 73 y.o.   MRN: [id number]  DOS:  09/19/2024   History of Present Illness Follow-up Blood pressure management   - Hypertension with daily home blood pressure monitoring - self d/c  antihypertensive medication on April 1st due to consistently low readings and hypotension, with even a quarter pill causing low blood pressure - Typical blood pressure readings around 100/60 mmHg; most recent reading 137  this morning - Uses compression socks for 30 minutes when blood pressure is low, resulting in increase to approximately 124/66 mmHg - Keeps antihypertensive medication available for use if needed - No symptoms of chest pain, shortness of breath, or palpitations  Coronary artery disease and antiplatelet therapy - History of coronary artery disease - Discontinued aspirin , denies side effects from it - No recent chest pain or respiratory difficulties  Lipid management - Currently taking Lipitor and Zetia  for cholesterol management - Most recent LDL cholesterol 64 mg/dL  Physical activity and functional status - Exercises regularly, working out five times per week - Active lifestyle including gym attendance, golf, camping, and boating     Review of Systems See above   Past Medical History:  Diagnosis Date   Basal cell carcinoma 05/07/2015   R mid back    Basal cell carcinoma 04/17/2022   L shoulder, EDC by Tawni Haverstock PA   Basal cell carcinoma 04/17/2022   Mid back, EDC by Christina Haverstock PA [place]   Basal cell carcinoma 10/09/2022   Left inf med scapula - EDC   Basal cell carcinoma 10/09/2022   Left prox med calf - EDC   Basal cell carcinoma 10/15/2023   R nose, MOHs completed 11/05/23   Hyperlipidemia 2007   Hypertension 2007   Inguinal hernia bilateral, non-recurrent    Normal cardiac stress test 2008   per pt   Squamous cell carcinoma of skin 10/09/2022   Right pectoral (in situ) -  EDC    Past Surgical History:  Procedure Laterality Date   INGUINAL HERNIA REPAIR Bilateral 05/31/2018   Procedure: LAPAROSCOPIC BILATERAL INGUINAL HERNIA REPAIR ERAS PATHWAY;  Surgeon: Vernetta Berg, MD;  Location: [facility];  Service: General;  Laterality: Bilateral;   INGUINAL HERNIA REPAIR Left 10/07/2018   Procedure: OPEN LEFT INGUINAL HERNIA REPAIR ERAS PATHWAY;  Surgeon: Vernetta Berg, MD;  Location: [facility];  Service: General;  Laterality: Left;  TAP BLOCK   INSERTION OF MESH Bilateral 05/31/2018   Procedure: INSERTION OF MESH;  Surgeon: Vernetta Berg, MD;  Location: [facility];  Service: General;  Laterality: Bilateral;   INSERTION OF MESH Left 10/07/2018   Procedure: INSERTION OF MESH;  Surgeon: Vernetta Berg, MD;  Location: [facility] OR;  Service: General;  Laterality: Left;   MOHS SURGERY Right 10/2023   nose    Current Outpatient Medications  Medication Instructions   aspirin  EC 81 mg, Oral, Daily, Swallow whole.   atorvastatin  (LIPITOR) 40 mg, Oral, Daily   ezetimibe  (ZETIA ) 10 mg, Oral, Daily   fluticasone  (FLONASE ) 50 MCG/ACT nasal spray 2 sprays, Each Nare, Daily PRN   Ivermectin  1 % CREA 1 Application, Apply externally, Daily at bedtime, Qhs to scalp   ketoconazole  (NIZORAL ) 2 % shampoo 1 Application, Topical, 2 times weekly, Wash scalp 2 times weekly, let sit 5 minutes and rinse out   mometasone  (ELOCON ) 0.1 % cream 1 Application, Topical, As directed, Apply thin layer to irritated areas twice daily until irritation  is more comfortable then stop       Objective:   Physical Exam BP 134/80   Pulse 71   Temp 97.8 F (36.6 C) (Oral)   Resp 16   Ht 5' 9 (1.753 m)   Wt 187 lb (84.8 kg)   SpO2 97%   BMI 27.62 kg/m  General:   Well developed, NAD, BMI noted. HEENT:  Normocephalic . Face symmetric, atraumatic Lungs:  CTA B Normal respiratory effort, no intercostal retractions, no accessory muscle use. Heart: RRR,  no  murmur.  Lower extremities: no pretibial edema bilaterally  Skin: Not pale. Not jaundice Neurologic:  alert & oriented X3.  Speech normal, gait appropriate for age and unassisted Psych--  Cognition and judgment appear intact.  Cooperative with normal attention span and concentration.  Behavior appropriate. No anxious or depressed appearing.      Assessment     Assessment HTN Hyperlipidemia Tinnitus (used to take xanas very rarely) E.D. BCC, rosacea - derm q year + FH CAD, coronary calcium  score February 2023: 94.3 H/o Posterior vitrious detachment R   Assessment & Plan Hyperlipidemia Hyperlipidemia is well-controlled with current medication regimen. Last LDL was 64, which is within target range. - Continue Atorvastatin  (Lipitor) 40 mg orally daily and Zetia   HTN: Stopped losartan  months ago due to low BPs, blood pressure remains well-controlled w/ recent readings as low as 100/60.  When BP is low he used a compression stocking and make the blood pressure go up to the 120s.  He has no symptoms.    Plan: Stay off losartan , restart if appropriate, continue monitoring BPs. Aspirin : Self stopped it, recommend to restart, benefits discussed.  He has s/e or  bleeding General Health Maintenance He declined flu and COVID-19 vaccinations despite being advised of the benefits and low risks.  . RTC 01-2025 CPX

## 2024-09-19 NOTE — Patient Instructions (Signed)
 go to the front desk for the checkout Please make an appointment for a physical exam by 01/2025    Check the  blood pressure regularly Blood pressure goal:  between 110/65 and  135/85. If it is consistently higher or lower, let me know   Please read more detailed instructions below    HYPERLIPIDEMIA: Your cholesterol levels are well-controlled with your current medications. -Continue taking Atorvastatin  (Lipitor) 40 mg orally daily. -Continue taking Ezetimibe  (Zetia ) 10 mg orally daily.  HYPERTENSION: Your blood pressure is well-controlled without medication. Recent readings have been stable. -Discontinue taking Losartan  (Cozaar ) 50 mg daily. -Continue monitoring your blood pressure daily.  -Resume taking aspirin  81 mg orally daily with breakfast.  GENERAL HEALTH MAINTENANCE: You were advised to consider vaccinations due to age-related risk factors. -Consider receiving flu and COVID-19 vaccinations.

## 2024-10-19 ENCOUNTER — Ambulatory Visit: Payer: Medicare HMO | Admitting: Dermatology

## 2024-10-19 DIAGNOSIS — D229 Melanocytic nevi, unspecified: Secondary | ICD-10-CM

## 2024-10-19 DIAGNOSIS — L820 Inflamed seborrheic keratosis: Secondary | ICD-10-CM

## 2024-10-19 DIAGNOSIS — C44519 Basal cell carcinoma of skin of other part of trunk: Secondary | ICD-10-CM

## 2024-10-19 DIAGNOSIS — L821 Other seborrheic keratosis: Secondary | ICD-10-CM

## 2024-10-19 DIAGNOSIS — L814 Other melanin hyperpigmentation: Secondary | ICD-10-CM | POA: Diagnosis not present

## 2024-10-19 DIAGNOSIS — D1801 Hemangioma of skin and subcutaneous tissue: Secondary | ICD-10-CM

## 2024-10-19 DIAGNOSIS — D485 Neoplasm of uncertain behavior of skin: Secondary | ICD-10-CM

## 2024-10-19 DIAGNOSIS — W908XXA Exposure to other nonionizing radiation, initial encounter: Secondary | ICD-10-CM

## 2024-10-19 DIAGNOSIS — L578 Other skin changes due to chronic exposure to nonionizing radiation: Secondary | ICD-10-CM | POA: Diagnosis not present

## 2024-10-19 DIAGNOSIS — C4491 Basal cell carcinoma of skin, unspecified: Secondary | ICD-10-CM

## 2024-10-19 DIAGNOSIS — Z1283 Encounter for screening for malignant neoplasm of skin: Secondary | ICD-10-CM

## 2024-10-19 DIAGNOSIS — I8393 Asymptomatic varicose veins of bilateral lower extremities: Secondary | ICD-10-CM

## 2024-10-19 DIAGNOSIS — Z85828 Personal history of other malignant neoplasm of skin: Secondary | ICD-10-CM

## 2024-10-19 DIAGNOSIS — S40212A Abrasion of left shoulder, initial encounter: Secondary | ICD-10-CM

## 2024-10-19 DIAGNOSIS — L570 Actinic keratosis: Secondary | ICD-10-CM

## 2024-10-19 DIAGNOSIS — Z8589 Personal history of malignant neoplasm of other organs and systems: Secondary | ICD-10-CM

## 2024-10-19 HISTORY — DX: Basal cell carcinoma of skin, unspecified: C44.91

## 2024-10-19 NOTE — Patient Instructions (Addendum)
 Electrodesiccation and Curettage ("Scrape and Burn") Wound Care Instructions  Leave the original bandage on for 24 hours if possible.  If the bandage becomes soaked or soiled before that time, it is OK to remove it and examine the wound.  A small amount of post-operative bleeding is normal.  If excessive bleeding occurs, remove the bandage, place gauze over the site and apply continuous pressure (no peeking) over the area for 30 minutes. If this does not work, please call our clinic as soon as possible or page your doctor if it is after hours.   Once a day, cleanse the wound with soap and water. It is fine to shower. If a thick crust develops you may use a Q-tip dipped into dilute hydrogen peroxide (mix 1:1 with water) to dissolve it.  Hydrogen peroxide can slow the healing process, so use it only as needed.    After washing, apply petroleum jelly (Vaseline) or an antibiotic ointment if your doctor prescribed one for you, followed by a bandage.    For best healing, the wound should be covered with a layer of ointment at all times. If you are not able to keep the area covered with a bandage to hold the ointment in place, this may mean re-applying the ointment several times a day.  Continue this wound care until the wound has healed and is no longer open. It may take several weeks for the wound to heal and close.  Itching and mild discomfort is normal during the healing process.  If you have any discomfort, you can take Tylenol  (acetaminophen ) or ibuprofen as directed on the bottle. (Please do not take these if you have an allergy to them or cannot take them for another reason).  Some redness, tenderness and white or yellow material in the wound is normal healing.  If the area becomes very sore and red, or develops a thick yellow-green material (pus), it may be infected; please notify us .    Wound healing continues for up to one year following surgery. It is not unusual to experience pain in the scar  from time to time during the interval.  If the pain becomes severe or the scar thickens, you should notify the office.    A slight amount of redness in a scar is expected for the first six months.  After six months, the redness will fade and the scar will soften and fade.  The color difference becomes less noticeable with time.  If there are any problems, return for a post-op surgery check at your earliest convenience.  To improve the appearance of the scar, you can use silicone scar gel, cream, or sheets (such as Mederma or Serica) every night for up to one year. These are available over the counter (without a prescription).  Please call our office at [phone] for any questions or concerns.  Due to recent changes in healthcare laws, you may see results of your pathology and/or laboratory studies on MyChart before the doctors have had a chance to review them. We understand that in some cases there may be results that are confusing or concerning to you. Please understand that not all results are received at the same time and often the doctors may need to interpret multiple results in order to provide you with the best plan of care or course of treatment. Therefore, we ask that you please give us  2 business days to thoroughly review all your results before contacting the office for clarification. Should we see a  critical lab result, you will be contacted sooner.   If You Need Anything After Your Visit  If you have any questions or concerns for your doctor, please call our main line at [phone] and press option 4 to reach your doctor's medical assistant. If no one answers, please leave a voicemail as directed and we will return your call as soon as possible. Messages left after 4 pm will be answered the following business day.   You may also send us  a message via MyChart. We typically respond to MyChart messages within 1-2 business days.  For prescription refills, please ask your pharmacy to  contact our office. Our fax number is [phone].  If you have an urgent issue when the clinic is closed that cannot wait until the next business day, you can page your doctor at the number below.    Please note that while we do our best to be available for urgent issues outside of office hours, we are not available 24/7.   If you have an urgent issue and are unable to reach us , you may choose to seek medical care at your doctor's office, retail clinic, urgent care center, or emergency room.  If you have a medical emergency, please immediately call 911 or go to the emergency department.  Pager Numbers  - Dr. Hester: [phone]  - Dr. Jackquline: [phone]  - Dr. Claudene: [phone]   - Dr. Raymund: [phone]  In the event of inclement weather, please call our main line at [phone] for an update on the status of any delays or closures.  Dermatology Medication Tips: Please keep the boxes that topical medications come in in order to help keep track of the instructions about where and how to use these. Pharmacies typically print the medication instructions only on the boxes and not directly on the medication tubes.   If your medication is too expensive, please contact our office at [phone] option 4 or send us  a message through MyChart.   We are unable to tell what your co-pay for medications will be in advance as this is different depending on your insurance coverage. However, we may be able to find a substitute medication at lower cost or fill out paperwork to get insurance to cover a needed medication.   If a prior authorization is required to get your medication covered by your insurance company, please allow us  1-2 business days to complete this process.  Drug prices often vary depending on where the prescription is filled and some pharmacies may offer cheaper prices.  The website www.goodrx.com contains coupons for medications through different pharmacies. The prices  here do not account for what the cost may be with help from insurance (it may be cheaper with your insurance), but the website can give you the price if you did not use any insurance.  - You can print the associated coupon and take it with your prescription to the pharmacy.  - You may also stop by our office during regular business hours and pick up a GoodRx coupon card.  - If you need your prescription sent electronically to a different pharmacy, notify our office through [facility] or by phone at [phone] option 4.     Si Usted Necesita Algo Despus de Su Visita  Tambin puede enviarnos un mensaje a travs de Clinical cytogeneticist. Por lo general respondemos a los mensajes de MyChart en el transcurso de 1 a 2 das hbiles.  Para renovar recetas, por favor pida a su farmacia que  se ponga en contacto con nuestra oficina. Randi lakes de fax es [place] [phone].  Si tiene un asunto urgente cuando la clnica est cerrada y que no puede esperar hasta el siguiente da hbil, puede llamar/localizar a su doctor(a) al nmero que aparece a continuacin.   Por favor, tenga en cuenta que aunque hacemos todo lo posible para estar disponibles para asuntos urgentes fuera del horario de [place], no estamos disponibles las 24 horas del da, los 7 [address]  [address] de la [place].   Si tiene un problema urgente y no puede comunicarse con nosotros, puede optar por buscar atencin mdica  en el consultorio de su doctor(a), en una clnica privada, en un centro de atencin urgente o en una sala de emergencias.  Si tiene Engineer, drilling, por favor llame inmediatamente al 911 o vaya a la sala de emergencias.  Nmeros de bper  - Dr. Hester: [phone]  - Dra. Jackquline: [phone]  - Dr. Claudene: [phone]  - Dra. Kitts: [phone]  En caso de inclemencias del [place], por favor llame a nuestra lnea principal al [phone] para una actualizacin sobre el estado de cualquier retraso o cierre.  Consejos  para la medicacin en dermatologa: Por favor, guarde las cajas en las que vienen los medicamentos de uso tpico para ayudarle a seguir las instrucciones sobre dnde y cmo usarlos. Las farmacias generalmente imprimen las instrucciones del medicamento slo en las cajas y no directamente en los tubos del [place].   Si su medicamento es muy caro, por favor, pngase en contacto con landry rieger llamando al [phone] y presione la opcin 4 o envenos un mensaje a travs de Clinical cytogeneticist.   No podemos decirle cul ser su copago por los medicamentos por adelantado ya que esto es diferente dependiendo de la cobertura de su seguro. Sin embargo, es posible que podamos encontrar un medicamento sustituto a Audiological scientist un formulario para que el seguro cubra el medicamento que se considera necesario.   Si se requiere una autorizacin previa para que su compaa de seguros malta su medicamento, por favor permtanos de 1 a 2 das hbiles para completar este proceso.  Los precios de los medicamentos varan con frecuencia dependiendo del Environmental consultant de dnde se surte la receta y alguna farmacias pueden ofrecer precios ms baratos.  El sitio web www.goodrx.com tiene cupones para medicamentos de Health and safety inspector. Los precios aqu no tienen en cuenta lo que podra costar con la ayuda del seguro (puede ser ms barato con su seguro), pero el sitio web puede darle el precio si no utiliz Tourist information centre manager.  - Puede imprimir el cupn correspondiente y llevarlo con su receta a la farmacia.  - Tambin puede pasar por nuestra oficina durante el horario de atencin regular y Education officer, museum una tarjeta de cupones de GoodRx.  - Si necesita que su receta se enve electrnicamente a una farmacia diferente, informe a nuestra oficina a travs de MyChart de [place] o por telfono llamando al [phone] y presione la opcin 4.

## 2024-10-19 NOTE — Progress Notes (Signed)
 Follow-Up Visit   Subjective  Ralph DISSINGER is a 73 y.o. male who presents for the following: Skin Cancer Screening and Full Body Skin Exam  The patient presents for Total-Body Skin Exam (TBSE) for skin cancer screening and mole check. The patient has spots, moles and lesions to be evaluated, some may be new or changing and the patient may have concern these could be cancer.  The following portions of the chart were reviewed this encounter and updated as appropriate: medications, allergies, medical history  Review of Systems:  No other skin or systemic complaints except as noted in HPI or Assessment and Plan.  Objective  Well appearing patient in no apparent distress; mood and affect are within normal limits.  A full examination was performed including scalp, head, eyes, ears, nose, lips, neck, chest, axillae, abdomen, back, buttocks, bilateral upper extremities, bilateral lower extremities, hands, feet, fingers, toes, fingernails, and toenails. All findings within normal limits unless otherwise noted below.   Relevant physical exam findings are noted in the Assessment and Plan.  scalp and face x 18 (18) Erythematous thin papules/macules with gritty scale.  R upper back paraspinal 1.5 cm pink patch.  trunk and arms x 17 (17) Erythematous stuck-on, waxy papule or plaque  Assessment & Plan   SKIN CANCER SCREENING PERFORMED TODAY.  ACTINIC DAMAGE - Chronic condition, secondary to cumulative UV/sun exposure - diffuse scaly erythematous macules with underlying dyspigmentation - Recommend daily broad spectrum sunscreen SPF 30+ to sun-exposed areas, reapply every 2 hours as needed.  - Staying in the shade or wearing long sleeves, sun glasses (UVA+UVB protection) and wide brim hats (4-inch brim around the entire circumference of the hat) are also recommended for sun protection.  - Call for new or changing lesions.  LENTIGINES, SEBORRHEIC KERATOSES, HEMANGIOMAS - Benign normal skin  lesions - Benign-appearing - Call for any changes  MELANOCYTIC NEVI - Tan-brown and/or pink-flesh-colored symmetric macules and papules - Benign appearing on exam today - Observation - Call clinic for new or changing moles - Recommend daily use of broad spectrum spf 30+ sunscreen to sun-exposed areas.   HISTORY OF BASAL CELL CARCINOMA OF THE SKIN - No evidence of recurrence today - Recommend regular full body skin exams - Recommend daily broad spectrum sunscreen SPF 30+ to sun-exposed areas, reapply every 2 hours as needed.  - Call if any new or changing lesions are noted between office visits  HISTORY OF SQUAMOUS CELL CARCINOMA OF THE SKIN - No evidence of recurrence today - No lymphadenopathy - Recommend regular full body skin exams - Recommend daily broad spectrum sunscreen SPF 30+ to sun-exposed areas, reapply every 2 hours as needed.  - Call if any new or changing lesions are noted between office visits  AK (ACTINIC KERATOSIS) (18) scalp and face x 18 (18) Actinic keratoses are precancerous spots that appear secondary to cumulative UV radiation exposure/sun exposure over time. They are chronic with expected duration over 1 year. A portion of actinic keratoses will progress to squamous cell carcinoma of the skin. It is not possible to reliably predict which spots will progress to skin cancer and so treatment is recommended to prevent development of skin cancer.  Recommend daily broad spectrum sunscreen SPF 30+ to sun-exposed areas, reapply every 2 hours as needed.  Recommend staying in the shade or wearing long sleeves, sun glasses (UVA+UVB protection) and wide brim hats (4-inch brim around the entire circumference of the hat). Call for new or changing lesions.  Plan to restart  5FU/Calcipotriene mix at follow up appointment Destruction of lesion - scalp and face x 18 (18) Complexity: simple   Destruction method: cryotherapy   Informed consent: discussed and consent obtained    Timeout:  patient name, date of birth, surgical site, and procedure verified Lesion destroyed using liquid nitrogen: Yes   Region frozen until ice ball extended beyond lesion: Yes   Outcome: patient tolerated procedure well with no complications   Post-procedure details: wound care instructions given    NEOPLASM OF UNCERTAIN BEHAVIOR OF SKIN R upper back paraspinal Epidermal / dermal shaving  Lesion diameter (cm):  1.5 Informed consent: discussed and consent obtained   Timeout: patient name, date of birth, surgical site, and procedure verified   Procedure prep:  Patient was prepped and draped in usual sterile fashion Prep type:  Isopropyl alcohol Anesthesia: the lesion was anesthetized in a standard fashion   Anesthetic:  1% lidocaine  w/ epinephrine  1-100,000 buffered w/ 8.4% NaHCO3 Instrument used: flexible razor blade   Hemostasis achieved with: pressure, aluminum chloride and electrodesiccation   Outcome: patient tolerated procedure well   Post-procedure details: sterile dressing applied and wound care instructions given   Dressing type: bandage (Mupirocin 2% ointment)    Destruction of lesion Complexity: extensive   Destruction method: electrodesiccation and curettage   Informed consent: discussed and consent obtained   Timeout:  patient name, date of birth, surgical site, and procedure verified Procedure prep:  Patient was prepped and draped in usual sterile fashion Prep type:  Isopropyl alcohol Anesthesia: the lesion was anesthetized in a standard fashion   Anesthetic:  1% lidocaine  w/ epinephrine  1-100,000 buffered w/ 8.4% NaHCO3 Curettage performed in three different directions: Yes   Electrodesiccation performed over the curetted area: Yes   Lesion length (cm):  1.5 Lesion width (cm):  1.5 Margin per side (cm):  0.2 Final wound size (cm):  1.9 Hemostasis achieved with:  pressure, aluminum chloride and electrodesiccation Outcome: patient tolerated procedure well with  no complications   Post-procedure details: sterile dressing applied and wound care instructions given   Dressing type: bandage and petrolatum    Specimen 1 - Surgical pathology Differential Diagnosis: D48.5 r/o BCC ED&C today Check Margins: No If positive for BCC will bx and treat the other lesion circle in photo of mid back spinal area.     INFLAMED SEBORRHEIC KERATOSIS (17) trunk and arms x 17 (17) Symptomatic, irritating, patient would like treated.  Destruction of lesion - trunk and arms x 17 (17) Complexity: simple   Destruction method: cryotherapy   Informed consent: discussed and consent obtained   Timeout:  patient name, date of birth, surgical site, and procedure verified Lesion destroyed using liquid nitrogen: Yes   Region frozen until ice ball extended beyond lesion: Yes   Outcome: patient tolerated procedure well with no complications   Post-procedure details: wound care instructions given     EXCORIATION Exam: crustat L ant shoulder Treatment Plan: Recommend vaseline. Call if not resolving. Recheck at follow up  Varicose Veins/Spider Veins - Dilated blue, purple or red veins at the lower extremities - Reassured - Smaller vessels can be treated by sclerotherapy (a procedure to inject a medicine into the veins to make them disappear) if desired, but the treatment is not covered by insurance. Larger vessels may be covered if symptomatic and we would refer to vascular surgeon if treatment desired.  Return in about 2 months (around 12/19/2024) for recheck bx sites and bx/tx other site, recheck L ant shoulder.  I,  Rosina Mayans, CMA, am acting as scribe for Alm Rhyme, MD .   Documentation: I have reviewed the above documentation for accuracy and completeness, and I agree with the above.  Alm Rhyme, MD

## 2024-10-21 LAB — SURGICAL PATHOLOGY

## 2024-10-24 ENCOUNTER — Ambulatory Visit: Payer: Self-pay | Admitting: Dermatology

## 2024-10-25 ENCOUNTER — Encounter: Payer: Self-pay | Admitting: Dermatology

## 2024-10-25 NOTE — Telephone Encounter (Addendum)
 Called patient regarding results. Discussed. Patient verbalized understanding and denied further questions.  ----- Message from Ralph [name] sent at 10/24/2024  5:28 PM EST ----- FINAL DIAGNOSIS        1. Skin, right upper back paraspinal :       BASAL CELL CARCINOMA, SUPERFICIAL AND NODULAR PATTERNS   Cancer = BCC Already treated  Recheck next visit ----- Message ----- From: Interface, Lab In Three Zero One Sent: 10/21/2024   5:40 PM EST To: Ralph JAYSON Rhyme, MD

## 2024-10-25 NOTE — Telephone Encounter (Addendum)
 Called and discussed results with patient. He verbalized understanding and denied further questions.    ----- Message from Alm Rhyme sent at 10/24/2024  5:28 PM EST ----- FINAL DIAGNOSIS        1. Skin, right upper back paraspinal :       BASAL CELL CARCINOMA, SUPERFICIAL AND NODULAR PATTERNS   Cancer = BCC Already treated  Recheck next visit ----- Message ----- From: Interface, Lab In Three Zero One Sent: 10/21/2024   5:40 PM EST To: Alm JAYSON Rhyme, MD

## 2024-12-22 ENCOUNTER — Encounter: Payer: Self-pay | Admitting: Dermatology

## 2024-12-22 ENCOUNTER — Ambulatory Visit: Admitting: Dermatology

## 2024-12-22 DIAGNOSIS — L719 Rosacea, unspecified: Secondary | ICD-10-CM | POA: Diagnosis not present

## 2024-12-22 DIAGNOSIS — W908XXA Exposure to other nonionizing radiation, initial encounter: Secondary | ICD-10-CM

## 2024-12-22 DIAGNOSIS — L570 Actinic keratosis: Secondary | ICD-10-CM

## 2024-12-22 DIAGNOSIS — C44519 Basal cell carcinoma of skin of other part of trunk: Secondary | ICD-10-CM

## 2024-12-22 DIAGNOSIS — D492 Neoplasm of unspecified behavior of bone, soft tissue, and skin: Secondary | ICD-10-CM

## 2024-12-22 DIAGNOSIS — Z5111 Encounter for antineoplastic chemotherapy: Secondary | ICD-10-CM

## 2024-12-22 DIAGNOSIS — Z79899 Other long term (current) drug therapy: Secondary | ICD-10-CM

## 2024-12-22 DIAGNOSIS — L578 Other skin changes due to chronic exposure to nonionizing radiation: Secondary | ICD-10-CM | POA: Diagnosis not present

## 2024-12-22 DIAGNOSIS — L739 Follicular disorder, unspecified: Secondary | ICD-10-CM | POA: Diagnosis not present

## 2024-12-22 DIAGNOSIS — Z7189 Other specified counseling: Secondary | ICD-10-CM | POA: Diagnosis not present

## 2024-12-22 MED ORDER — MUPIROCIN 2 % EX OINT: TOPICAL_OINTMENT | CUTANEOUS | 1 refills | Status: AC

## 2024-12-22 MED ORDER — AZELAIC ACID 15 % EX GEL: CUTANEOUS | 11 refills | Status: AC

## 2024-12-22 MED ORDER — FLUOROURACIL 5 % EX CREA: TOPICAL_CREAM | CUTANEOUS | 0 refills | Status: AC

## 2024-12-22 NOTE — Progress Notes (Unsigned)
 "  Follow-Up Visit   Subjective  Ralph [name] is a 74 y.o. male who presents for the following: Rechecking left anterior shoulder, biopsy on 10/19/24 at right upper back paraspinal showed BCC and here today to treat other lesion in photo taken at mid back spinal area. Per last visit plan to restart 5FU/calcipotriene mix to treat Aks. Patient also requesting refill on SM triple cream for rosacea.   The patient has spots, moles and lesions to be evaluated, some may be new or changing and the patient may have concern these could be cancer.  The following portions of the chart were reviewed this encounter and updated as appropriate: medications, allergies, medical history  Review of Systems:  No other skin or systemic complaints except as noted in HPI or Assessment and Plan.  Objective  Well appearing patient in no apparent distress; mood and affect are within normal limits.  A focused examination was performed of the following areas: Left anterior shoulder, scalp, face, back  Relevant exam findings are noted in the Assessment and Plan.  Mid back spinal area 2.2 cm pink patch    Assessment & Plan   ACTINIC DAMAGE WITH PRECANCEROUS ACTINIC KERATOSES Counseling for Topical Chemotherapy Management: Patient exhibits: - Severe, confluent actinic changes with pre-cancerous actinic keratoses that is secondary to cumulative UV radiation exposure over time - Condition that is severe; chronic, not at goal. - diffuse scaly erythematous macules and papules with underlying dyspigmentation - Discussed Prescription Field Treatment topical Chemotherapy for Severe, Chronic Confluent Actinic Changes with Pre-Cancerous Actinic Keratoses Field treatment involves treatment of an entire area of skin that has confluent Actinic Changes (Sun/ Ultraviolet light damage) and PreCancerous Actinic Keratoses by method of PhotoDynamic Therapy (PDT) and/or prescription Topical Chemotherapy agents such as  5-fluorouracil , 5-fluorouracil /calcipotriene, and/or imiquimod.  The purpose is to decrease the number of clinically evident and subclinical PreCancerous lesions to prevent progression to development of skin cancer by chemically destroying early precancer changes that may or may not be visible.  It has been shown to reduce the risk of developing skin cancer in the treated area. As a result of treatment, redness, scaling, crusting, and open sores may occur during treatment course. One or more than one of these methods may be used and may have to be used several times to control, suppress and eliminate the PreCancerous changes. Discussed treatment course, expected reaction, and possible side effects. - Recommend daily broad spectrum sunscreen SPF 30+ to sun-exposed areas, reapply every 2 hours as needed.  - Staying in the shade or wearing long sleeves, sun glasses (UVA+UVB protection) and wide brim hats (4-inch brim around the entire circumference of the hat) are also recommended. - Call for new or changing lesions. - Start  5-fluorouracil /calcipotriene cream twice a day for 7 days to affected areas including temples, cheeks. Prescription sent to [facility]. Patient advised they will receive an email to purchase the medication online and have it sent to their home. Patient provided with handout reviewing treatment course and side effects and advised to call or message us  on MyChart with any concerns.  Reviewed course of treatment and expected reaction.  Patient advised to expect inflammation and crusting and advised that erosions are possible.  Patient advised to be diligent with sun protection during and after treatment. Counseled to keep medication out of reach of children and pets.  Rosacea /folliculitis Scalp Chronic and persistent condition with duration or expected duration over one year. Condition is symptomatic / bothersome to  patient. Not to goal.  Rosacea is a chronic  progressive skin condition usually affecting the face of adults, causing redness and/or acne bumps. It is treatable but not curable. It sometimes affects the eyes (ocular rosacea) as well. It may respond to topical and/or systemic medication and can flare with stress, sun exposure, alcohol, exercise, topical steroids (including hydrocortisone/cortisone 10) and some foods.  Daily application of broad spectrum spf 30+ sunscreen to face is recommended to reduce flares. Treatment plan: Cont SM Triple cream qhs to scalp. 1 yr RF sent for patient.   NEOPLASM OF SKIN Mid back spinal area - Epidermal / dermal shaving  Lesion diameter (cm):  2.2 Informed consent: discussed and consent obtained   Timeout: patient name, date of birth, surgical site, and procedure verified   Procedure prep:  Patient was prepped and draped in usual sterile fashion Prep type:  Isopropyl alcohol Anesthesia: the lesion was anesthetized in a standard fashion   Anesthetic:  1% lidocaine  w/ epinephrine  1-100,000 buffered w/ 8.4% NaHCO3 Instrument used: flexible razor blade   Hemostasis achieved with: pressure, aluminum chloride and electrodesiccation   Outcome: patient tolerated procedure well   Post-procedure details: sterile dressing applied and wound care instructions given   Dressing type: bandage and petrolatum    - Destruction of lesion  Destruction method: electrodesiccation and curettage   Informed consent: discussed and consent obtained   Timeout:  patient name, date of birth, surgical site, and procedure verified Anesthesia: the lesion was anesthetized in a standard fashion   Anesthetic:  1% lidocaine  w/ epinephrine  1-100,000 buffered w/ 8.4% NaHCO3 Curettage performed in three different directions: Yes   Electrodesiccation performed over the curetted area: Yes   Curettage cycles:  3 Hemostasis achieved with:  electrodesiccation Outcome: patient tolerated procedure well with no complications   Post-procedure  details: sterile dressing applied and wound care instructions given   Dressing type: petrolatum    Specimen 1 - Surgical pathology Differential Diagnosis: R/o BCC  Check Margins: No 2.2 cm pink patch ED&C today  Start mupirocin  2% ointment once daily with bandage change.   ACTINIC SKIN DAMAGE   CHEMOTHERAPY MANAGEMENT, ENCOUNTER FOR   COUNSELING AND COORDINATION OF CARE   MEDICATION MANAGEMENT   ROSACEA   Existing Treatments - Ivermectin  1 % CREA - Apply 1 Application topically at bedtime. Qhs to scalp - ketoconazole  (NIZORAL ) 2 % shampoo - APPLY 1 APPLICATION TOPICALLY 2 (TWO) TIMES A WEEK. WASH SCALP 2 TIMES WEEKLY, LET SIT 5 MINUTES AND RINSE OUT FOLLICULITIS    Return in about 6 months (around 06/21/2025) for w/ Dr. Hester, TBSE.  I, Lonell GAILS. Wilfred, CMA, am acting as scribe for Alm Hester, MD.  Documentation: I have reviewed the above documentation for accuracy and completeness, and I agree with the above.  Alm Hester, MD    "

## 2024-12-22 NOTE — Patient Instructions (Addendum)
 Mupirocin  ointment- apply to back (biopsy site)   - Re-start 5-fluorouracil /calcipotriene cream twice a day for 7 days to affected areas including temples, cheeks. Prescription sent to [facility].   Reviewed course of treatment and expected reaction.  Patient advised to expect inflammation and crusting and advised that erosions are possible.  Patient advised to be diligent with sun protection during and after treatment. Counseled to keep medication out of reach of children and pets.   Instructions for [facility]  One or more of your medications was sent to the [facility] mail order [facility] pharmacy. You will receive an email from them and can purchase the medicine through that link. It will then be mailed to your home at the address you confirmed. If for any reason you do not receive an email from them, please check your spam folder. If you still do not find the email, please let us  know. [facility] phone number is [phone].    Electrodesiccation and Curettage (Scrape and Burn) Wound Care Instructions  Leave the original bandage on for 24 hours if possible.  If the bandage becomes soaked or soiled before that time, it is OK to remove it and examine the wound.  A small amount of post-operative bleeding is normal.  If excessive bleeding occurs, remove the bandage, place gauze over the site and apply continuous pressure (no peeking) over the area for 30 minutes. If this does not work, please call our clinic as soon as possible or page your doctor if it is after hours.   Once a day, cleanse the wound with soap and water. It is fine to shower. If a thick crust develops you may use a Q-tip dipped into dilute hydrogen peroxide (mix 1:1 with water) to dissolve it.  Hydrogen peroxide can slow the healing process, so use it only as needed.    After washing, apply petroleum jelly (Vaseline) or an antibiotic ointment if your doctor prescribed one  for you, followed by a bandage.    For best healing, the wound should be covered with a layer of ointment at all times. If you are not able to keep the area covered with a bandage to hold the ointment in place, this may mean re-applying the ointment several times a day.  Continue this wound care until the wound has healed and is no longer open. It may take several weeks for the wound to heal and close.  Itching and mild discomfort is normal during the healing process.  If you have any discomfort, you can take Tylenol  (acetaminophen ) or ibuprofen as directed on the bottle. (Please do not take these if you have an allergy to them or cannot take them for another reason).  Some redness, tenderness and white or yellow material in the wound is normal healing.  If the area becomes very sore and red, or develops a thick yellow-green material (pus), it may be infected; please notify us .    Wound healing continues for up to one year following surgery. It is not unusual to experience pain in the scar from time to time during the interval.  If the pain becomes severe or the scar thickens, you should notify the office.    A slight amount of redness in a scar is expected for the first six months.  After six months, the redness will fade and the scar will soften and fade.  The color difference becomes less noticeable with time.  If there are any problems, return for  a post-op surgery check at your earliest convenience.  To improve the appearance of the scar, you can use silicone scar gel, cream, or sheets (such as Mederma or Serica) every night for up to one year. These are available over the counter (without a prescription).  Please call our office at [phone] for any questions or concerns.    Due to recent changes in healthcare laws, you may see results of your pathology and/or laboratory studies on MyChart before the doctors have had a chance to review them. We understand that in some cases there may be  results that are confusing or concerning to you. Please understand that not all results are received at the same time and often the doctors may need to interpret multiple results in order to provide you with the best plan of care or course of treatment. Therefore, we ask that you please give us  2 business days to thoroughly review all your results before contacting the office for clarification. Should we see a critical lab result, you will be contacted sooner.   If You Need Anything After Your Visit  If you have any questions or concerns for your doctor, please call our main line at [phone] and press option 4 to reach your doctor's medical assistant. If no one answers, please leave a voicemail as directed and we will return your call as soon as possible. Messages left after 4 pm will be answered the following business day.   You may also send us  a message via MyChart. We typically respond to MyChart messages within 1-2 business days.  For prescription refills, please ask your pharmacy to contact our office. Our fax number is [phone].  If you have an urgent issue when the clinic is closed that cannot wait until the next business day, you can page your doctor at the number below.    Please note that while we do our best to be available for urgent issues outside of office hours, we are not available 24/7.   If you have an urgent issue and are unable to reach us , you may choose to seek medical care at your doctor's office, retail clinic, urgent care center, or emergency room.  If you have a medical emergency, please immediately call 911 or go to the emergency department.  Pager Numbers  - Dr. Hester: [phone]  - Dr. Jackquline: [phone]  - Dr. Claudene: [phone]   In the event of inclement weather, please call our main line at [phone] for an update on the status of any delays or closures.  Dermatology Medication Tips: Please keep the boxes that topical medications  come in in order to help keep track of the instructions about where and how to use these. Pharmacies typically print the medication instructions only on the boxes and not directly on the medication tubes.   If your medication is too expensive, please contact our office at [phone] option 4 or send us  a message through MyChart.   We are unable to tell what your co-pay for medications will be in advance as this is different depending on your insurance coverage. However, we may be able to find a substitute medication at lower cost or fill out paperwork to get insurance to cover a needed medication.   If a prior authorization is required to get your medication covered by your insurance company, please allow us  1-2 business days to complete this process.  Drug prices often vary depending on where the prescription is filled and some pharmacies may offer  cheaper prices.  The website www.goodrx.com contains coupons for medications through different pharmacies. The prices here do not account for what the cost may be with help from insurance (it may be cheaper with your insurance), but the website can give you the price if you did not use any insurance.  - You can print the associated coupon and take it with your prescription to the pharmacy.  - You may also stop by our office during regular business hours and pick up a GoodRx coupon card.  - If you need your prescription sent electronically to a different pharmacy, notify our office through [facility] or by phone at [phone] option 4.     Si Usted Necesita Algo Despus de Su Visita  Tambin puede enviarnos un mensaje a travs de Clinical Cytogeneticist. Por lo general respondemos a los mensajes de MyChart en el transcurso de 1 a 2 das hbiles.  Para renovar recetas, por favor pida a su farmacia que se ponga en contacto con nuestra oficina. Randi lakes de fax es [place] [phone].  Si tiene un asunto urgente cuando la clnica est cerrada y que no  puede esperar hasta el siguiente da hbil, puede llamar/localizar a su doctor(a) al nmero que aparece a continuacin.   Por favor, tenga en cuenta que aunque hacemos todo lo posible para estar disponibles para asuntos urgentes fuera del horario de [place], no estamos disponibles las 24 horas del da, los 7 [address]  [address] de la [place].   Si tiene un problema urgente y no puede comunicarse con nosotros, puede optar por buscar atencin mdica  en el consultorio de su doctor(a), en una clnica privada, en un centro de atencin urgente o en una sala de emergencias.  Si tiene engineer, drilling, por favor llame inmediatamente al 911 o vaya a la sala de emergencias.  Nmeros de bper  - Dr. Hester: [phone]  - Dra. Jackquline: [phone]  - Dr. Claudene: [phone]   En caso de inclemencias del tiempo, por favor llame a landry capes principal al [phone] para una actualizacin sobre el [place] de cualquier retraso o cierre.  Consejos para la medicacin en dermatologa: Por favor, guarde las cajas en las que vienen los medicamentos de uso tpico para ayudarle a seguir las instrucciones sobre dnde y cmo usarlos. Las farmacias generalmente imprimen las instrucciones del medicamento slo en las cajas y no directamente en los tubos del [place].   Si su medicamento es muy caro, por favor, pngase en contacto con landry rieger llamando al [phone] y presione la opcin 4 o envenos un mensaje a travs de Clinical Cytogeneticist.   No podemos decirle cul ser su copago por los medicamentos por adelantado ya que esto es diferente dependiendo de la cobertura de su seguro. Sin embargo, es posible que podamos encontrar un medicamento sustituto a audiological scientist un formulario para que el seguro cubra el medicamento que se considera necesario.   Si se requiere una autorizacin previa para que su compaa de seguros cubra su medicamento, por favor permtanos de 1 a 2 das hbiles para completar este  proceso.  Los precios de los medicamentos varan con frecuencia dependiendo del environmental consultant de dnde se surte la receta y alguna farmacias pueden ofrecer precios ms baratos.  El sitio web www.goodrx.com tiene cupones para medicamentos de health and safety inspector. Los precios aqu no tienen en cuenta lo que podra costar con la ayuda del seguro (puede ser ms barato con su seguro), pero el sitio web puede darle el precio si no dance movement psychotherapist  ningn seguro.  - Puede imprimir el cupn correspondiente y llevarlo con su receta a la farmacia.  - Tambin puede pasar por nuestra oficina durante el horario de atencin regular y education officer, museum una tarjeta de cupones de GoodRx.  - Si necesita que su receta se enve electrnicamente a una farmacia diferente, informe a nuestra oficina a travs de MyChart de [place] o por telfono llamando al [phone] y presione la opcin 4.

## 2024-12-23 LAB — SURGICAL PATHOLOGY

## 2024-12-26 ENCOUNTER — Ambulatory Visit: Payer: Self-pay | Admitting: Dermatology

## 2024-12-27 ENCOUNTER — Encounter: Payer: Self-pay | Admitting: Dermatology

## 2024-12-27 NOTE — Telephone Encounter (Addendum)
 Patient returned call. Discussed results with patient. Patient denied further questions. Will recheck at next followup  ----- Message from Alm Rhyme, MD sent at 12/26/2024  5:25 PM EST ----- FINAL DIAGNOSIS        1. Skin, mid back spinal area :       BASAL CELL CARCINOMA, SUPERFICIAL AND NODULAR PATTERNS   Cancer = BCC Already treated Recheck next visit

## 2024-12-27 NOTE — Telephone Encounter (Addendum)
 Tried calling patient regarding results. No answer. Lm for patient to return call.  ----- Message from Alm Rhyme, MD sent at 12/26/2024  5:25 PM EST ----- FINAL DIAGNOSIS        1. Skin, mid back spinal area :       BASAL CELL CARCINOMA, SUPERFICIAL AND NODULAR PATTERNS   Cancer = BCC Already treated Recheck next visit

## 2025-01-10 ENCOUNTER — Telehealth: Payer: Self-pay

## 2025-01-10 MED ORDER — MOMETASONE FUROATE 0.1 % EX OINT: TOPICAL_OINTMENT | CUTANEOUS | 0 refills | Status: AC

## 2025-01-10 NOTE — Telephone Encounter (Signed)
 Patient LVM stating that he used 5FU/calcipotriene on half of his face and he is extremely uncomfortable. Alan advised patient on Friday to d/c and he is now using Vaseline and A&D ointment. He has some old mometasone  but is wondering if there is something else he can use to help ease the discomfort.  Lonell RAMAN., RMA

## 2025-01-10 NOTE — Telephone Encounter (Signed)
 Discussed with patient and prescription sent to pharmacy.

## 2025-02-14 ENCOUNTER — Encounter: Admitting: Internal Medicine

## 2025-06-29 ENCOUNTER — Ambulatory Visit: Admitting: Dermatology
